# Patient Record
Sex: Female | Born: 1975 | Race: Black or African American | Hispanic: No | Marital: Married | Smoking: Never smoker
Health system: Southern US, Community
[De-identification: ages and names within clinical notes are randomized; demographics above are authoritative.]

## PROBLEM LIST (undated history)

## (undated) DIAGNOSIS — M329 Systemic lupus erythematosus, unspecified: Secondary | ICD-10-CM

## (undated) DIAGNOSIS — I10 Essential (primary) hypertension: Secondary | ICD-10-CM

## (undated) DIAGNOSIS — IMO0002 Reserved for concepts with insufficient information to code with codable children: Secondary | ICD-10-CM

---

## 2012-09-23 ENCOUNTER — Encounter (HOSPITAL_COMMUNITY): Payer: Self-pay

## 2012-09-23 ENCOUNTER — Emergency Department (HOSPITAL_COMMUNITY): Payer: Self-pay

## 2012-09-23 ENCOUNTER — Emergency Department (HOSPITAL_COMMUNITY)
Admission: EM | Admit: 2012-09-23 | Discharge: 2012-09-23 | Disposition: A | Discharge status: Home / Self Care | Payer: Self-pay | Attending: Emergency Medicine | Admitting: Emergency Medicine

## 2012-09-23 DIAGNOSIS — Y9389 Activity, other specified: Secondary | ICD-10-CM | POA: Insufficient documentation

## 2012-09-23 DIAGNOSIS — S6990XA Unspecified injury of unspecified wrist, hand and finger(s), initial encounter: Secondary | ICD-10-CM | POA: Insufficient documentation

## 2012-09-23 DIAGNOSIS — Z8739 Personal history of other diseases of the musculoskeletal system and connective tissue: Secondary | ICD-10-CM | POA: Insufficient documentation

## 2012-09-23 DIAGNOSIS — S59909A Unspecified injury of unspecified elbow, initial encounter: Secondary | ICD-10-CM | POA: Insufficient documentation

## 2012-09-23 DIAGNOSIS — M25519 Pain in unspecified shoulder: Secondary | ICD-10-CM

## 2012-09-23 DIAGNOSIS — S4980XA Other specified injuries of shoulder and upper arm, unspecified arm, initial encounter: Secondary | ICD-10-CM | POA: Insufficient documentation

## 2012-09-23 DIAGNOSIS — W010XXA Fall on same level from slipping, tripping and stumbling without subsequent striking against object, initial encounter: Secondary | ICD-10-CM | POA: Insufficient documentation

## 2012-09-23 DIAGNOSIS — S46909A Unspecified injury of unspecified muscle, fascia and tendon at shoulder and upper arm level, unspecified arm, initial encounter: Secondary | ICD-10-CM | POA: Insufficient documentation

## 2012-09-23 DIAGNOSIS — Y929 Unspecified place or not applicable: Secondary | ICD-10-CM | POA: Insufficient documentation

## 2012-09-23 HISTORY — DX: Reserved for concepts with insufficient information to code with codable children: IMO0002

## 2012-09-23 HISTORY — DX: Systemic lupus erythematosus, unspecified: M32.9

## 2012-09-23 MED ORDER — NAPROXEN 500 MG PO TABS: 500.0000 mg | ORAL_TABLET | Freq: Two times a day (BID) | ORAL | Status: DC | PRN

## 2012-09-23 MED ORDER — IBUPROFEN 400 MG PO TABS
600.0000 mg | ORAL_TABLET | Freq: Once | ORAL | Status: AC
  Administered 2012-09-23: 600 mg via ORAL
  Filled 2012-09-23: qty 1

## 2012-09-23 MED ORDER — OXYCODONE-ACETAMINOPHEN 5-325 MG PO TABS
2.0000 | ORAL_TABLET | Freq: Once | ORAL | Status: AC
  Administered 2012-09-23: 2 via ORAL
  Filled 2012-09-23: qty 2

## 2012-09-23 NOTE — ED Notes (Signed)
Patient able to ambulate around room independently

## 2012-09-23 NOTE — ED Notes (Signed)
Pt presents with L shoulder, arm and wrist pain after slipping on soap in tub and falling.  Pt reports hitting back of her head, landing on L shoulder.  Pt denies any LOC.

## 2012-09-23 NOTE — ED Provider Notes (Signed)
History    37 year old female with left shoulder pain. Onset shortly before arrival. Patient slipped earlier today in the shower fell in her left side. Persistent shoulder pain since then. Denies any significant pain anywhere else. No numbness, tingling or loss of strength. She did strike her head, but she denies headache, neck or back pain. No visual complaints. No use of blood thinning medication.  CSN: [id number]  Arrival date & time 09/23/12  1229   First MD Initiated Contact with Patient 09/23/12 1349      Chief Complaint  Patient presents with  . Fall    (Consider location/radiation/quality/duration/timing/severity/associated sxs/prior treatment) HPI  Past Medical History  Diagnosis Date  . Lupus     Past Surgical History  Procedure Date  . Cesarean section     History reviewed. No pertinent family history.  History  Substance Use Topics  . Smoking status: Never Smoker   . Smokeless tobacco: Not on file  . Alcohol Use: Yes    OB History    Grav Para Term Preterm Abortions TAB SAB Ect Mult Living                  Review of Systems  All systems reviewed and negative, other than as noted in HPI.   Allergies  Review of patient's allergies indicates no known allergies.  Home Medications   Current Outpatient Rx  Name  Route  Sig  Dispense  Refill  . IBUPROFEN 200 MG PO TABS   Oral   Take 200 mg by mouth every 6 (six) hours as needed. For pain         . NAPROXEN 500 MG PO TABS   Oral   Take 1 tablet (500 mg total) by mouth 2 (two) times daily as needed.   20 tablet   0     BP 164/97  Pulse 90  Temp 98.4 F (36.9 C) (Oral)  Resp 16  SpO2 100%  LMP 09/09/2012  Physical Exam  Nursing note and vitals reviewed. Constitutional: She appears well-developed and well-nourished. No distress.  HENT:  Head: Normocephalic and atraumatic.  Eyes: Conjunctivae normal are normal. Right eye exhibits no discharge. Left eye exhibits no discharge.  Neck:  Neck supple.  Cardiovascular: Normal rate, regular rhythm and normal heart sounds.  Exam reveals no gallop and no friction rub.   No murmur heard. Pulmonary/Chest: Effort normal and breath sounds normal. No respiratory distress.  Abdominal: Soft. She exhibits no distension. There is no tenderness.  Musculoskeletal: She exhibits no edema and no tenderness.       Left shoulder symmetric as compared to the right. No concerning skin lesions noted. Point tenderness over the left a.c. joint. Full range of motion of the left shoulder. Neurovascular intact distally. No midline spinal tenderness.  Neurological: She is alert.  Skin: Skin is warm and dry.  Psychiatric: She has a normal mood and affect. Her behavior is normal. Thought content normal.    ED Course  Procedures (including critical care time)  Labs Reviewed - No data to display Dg Shoulder Left  09/23/2012  *RADIOLOGY REPORT*  Clinical Data: Fall, left shoulder pain.  LEFT SHOULDER - 2+ VIEW  Comparison: None  Findings: No acute bony abnormality.  Specifically, no fracture, subluxation, or dislocation.  Soft tissues are intact.  IMPRESSION: No acute bony abnormality.   Original Report Authenticated By: Charlett Nose, M.D.      1. AC joint pain       MDM  36yl  female with left shoulder pain. Patient has point tenderness over her left a.c. joint. Her imaging is negative for any acute abnormalities. Suspect sprain, or possibly minimal dislocation No significant malalignment of a.c. joint was appreciated. Plan sling for comfort and as needed pain medication.        Raeford Razor, MD 09/23/12 [phone]

## 2014-04-06 ENCOUNTER — Encounter: Payer: Self-pay | Admitting: Internal Medicine

## 2014-04-06 ENCOUNTER — Ambulatory Visit: Payer: Self-pay | Attending: Internal Medicine | Admitting: Internal Medicine

## 2014-04-06 VITALS — BP 145/90 | HR 81 | Temp 98.2°F | Resp 16 | Wt 252.0 lb

## 2014-04-06 DIAGNOSIS — R635 Abnormal weight gain: Secondary | ICD-10-CM

## 2014-04-06 DIAGNOSIS — R7309 Other abnormal glucose: Secondary | ICD-10-CM

## 2014-04-06 DIAGNOSIS — Z8269 Family history of other diseases of the musculoskeletal system and connective tissue: Secondary | ICD-10-CM

## 2014-04-06 DIAGNOSIS — R768 Other specified abnormal immunological findings in serum: Secondary | ICD-10-CM

## 2014-04-06 DIAGNOSIS — R894 Abnormal immunological findings in specimens from other organs, systems and tissues: Secondary | ICD-10-CM

## 2014-04-06 DIAGNOSIS — M2550 Pain in unspecified joint: Secondary | ICD-10-CM

## 2014-04-06 DIAGNOSIS — R5381 Other malaise: Secondary | ICD-10-CM

## 2014-04-06 DIAGNOSIS — K5900 Constipation, unspecified: Secondary | ICD-10-CM

## 2014-04-06 DIAGNOSIS — Z840 Family history of diseases of the skin and subcutaneous tissue: Secondary | ICD-10-CM

## 2014-04-06 DIAGNOSIS — R7303 Prediabetes: Secondary | ICD-10-CM

## 2014-04-06 DIAGNOSIS — Z139 Encounter for screening, unspecified: Secondary | ICD-10-CM

## 2014-04-06 DIAGNOSIS — R5383 Other fatigue: Principal | ICD-10-CM

## 2014-04-06 DIAGNOSIS — I10 Essential (primary) hypertension: Secondary | ICD-10-CM

## 2014-04-06 LAB — CBC WITH DIFFERENTIAL/PLATELET
BASOS ABS: 0 10*3/uL (ref 0.0–0.1)
BASOS PCT: 0 % (ref 0–1)
EOS ABS: 0.1 10*3/uL (ref 0.0–0.7)
EOS PCT: 1 % (ref 0–5)
HEMATOCRIT: 29.6 % — AB (ref 36.0–46.0)
HEMOGLOBIN: 9.2 g/dL — AB (ref 12.0–15.0)
Lymphocytes Relative: 23 % (ref 12–46)
Lymphs Abs: 1.4 10*3/uL (ref 0.7–4.0)
MCH: 22.7 pg — AB (ref 26.0–34.0)
MCHC: 31.1 g/dL (ref 30.0–36.0)
MCV: 73.1 fL — AB (ref 78.0–100.0)
MONO ABS: 0.4 10*3/uL (ref 0.1–1.0)
MONOS PCT: 6 % (ref 3–12)
NEUTROS ABS: 4.1 10*3/uL (ref 1.7–7.7)
Neutrophils Relative %: 70 % (ref 43–77)
Platelets: 383 10*3/uL (ref 150–400)
RBC: 4.05 MIL/uL (ref 3.87–5.11)
RDW: 17 % — AB (ref 11.5–15.5)
WBC: 5.9 10*3/uL (ref 4.0–10.5)

## 2014-04-06 LAB — COMPLETE METABOLIC PANEL WITH GFR
ALBUMIN: 4 g/dL (ref 3.5–5.2)
ALK PHOS: 64 U/L (ref 39–117)
ALT: 11 U/L (ref 0–35)
AST: 16 U/L (ref 0–37)
BUN: 8 mg/dL (ref 6–23)
CO2: 27 mEq/L (ref 19–32)
CREATININE: 0.85 mg/dL (ref 0.50–1.10)
Calcium: 9.1 mg/dL (ref 8.4–10.5)
Chloride: 105 mEq/L (ref 96–112)
GFR, EST NON AFRICAN AMERICAN: 88 mL/min
GFR, Est African American: >89 mL/min
GLUCOSE: 74 mg/dL (ref 70–99)
POTASSIUM: 4.2 meq/L (ref 3.5–5.3)
Sodium: 139 mEq/L (ref 135–145)
Total Bilirubin: 0.5 mg/dL (ref 0.2–1.2)
Total Protein: 7.5 g/dL (ref 6.0–8.3)

## 2014-04-06 LAB — RHEUMATOID FACTOR: Rhuematoid fact SerPl-aCnc: <10 IU/mL (ref <=14)

## 2014-04-06 LAB — HEMOGLOBIN A1C
Hgb A1c MFr Bld: 6.1 % — ABNORMAL HIGH (ref <5.7)
MEAN PLASMA GLUCOSE: 128 mg/dL — AB (ref <117)

## 2014-04-06 MED ORDER — HYDROCHLOROTHIAZIDE 25 MG PO TABS: 25.0000 mg | ORAL_TABLET | Freq: Every day | ORAL | Status: DC

## 2014-04-06 NOTE — Patient Instructions (Signed)
Diabetes Mellitus and Food It is important for you to manage your blood sugar (glucose) level. Your blood glucose level can be greatly affected by what you eat. Eating healthier foods in the appropriate amounts throughout the day at about the same time each day will help you control your blood glucose level. It can also help slow or prevent worsening of your diabetes mellitus. Healthy eating may even help you improve the level of your blood pressure and reach or maintain a healthy weight.  HOW CAN FOOD AFFECT ME? Carbohydrates Carbohydrates affect your blood glucose level more than any other type of food. Your dietitian will help you determine how many carbohydrates to eat at each meal and teach you how to count carbohydrates. Counting carbohydrates is important to keep your blood glucose at a healthy level, especially if you are using insulin or taking certain medicines for diabetes mellitus. Alcohol Alcohol can cause sudden decreases in blood glucose (hypoglycemia), especially if you use insulin or take certain medicines for diabetes mellitus. Hypoglycemia can be a life-threatening condition. Symptoms of hypoglycemia (sleepiness, dizziness, and disorientation) are similar to symptoms of having too much alcohol.  If your health care provider has given you approval to drink alcohol, do so in moderation and use the following guidelines:  Women should not have more than one drink per day, and men should not have more than two drinks per day. One drink is equal to:  12 oz of beer.  5 oz of wine.  1 oz of hard liquor.  Do not drink on an empty stomach.  Keep yourself hydrated. Have water, diet soda, or unsweetened iced tea.  Regular soda, juice, and other mixers might contain a lot of carbohydrates and should be counted. WHAT FOODS ARE NOT RECOMMENDED? As you make food choices, it is important to remember that all foods are not the same. Some foods have fewer nutrients per serving than other  foods, even though they might have the same number of calories or carbohydrates. It is difficult to get your body what it needs when you eat foods with fewer nutrients. Examples of foods that you should avoid that are high in calories and carbohydrates but low in nutrients include:  Trans fats (most processed foods list trans fats on the Nutrition Facts label).  Regular soda.  Juice.  Candy.  Sweets, such as cake, pie, doughnuts, and cookies.  Fried foods. WHAT FOODS CAN I EAT? Have nutrient-rich foods, which will nourish your body and keep you healthy. The food you should eat also will depend on several factors, including:  The calories you need.  The medicines you take.  Your weight.  Your blood glucose level.  Your blood pressure level.  Your cholesterol level. You also should eat a variety of foods, including:  Protein, such as meat, poultry, fish, tofu, nuts, and seeds (lean animal proteins are best).  Fruits.  Vegetables.  Dairy products, such as milk, cheese, and yogurt (low fat is best).  Breads, grains, pasta, cereal, rice, and beans.  Fats such as olive oil, trans fat-free margarine, canola oil, avocado, and olives. DOES EVERYONE WITH DIABETES MELLITUS HAVE THE SAME MEAL PLAN? Because every person with diabetes mellitus is different, there is not one meal plan that works for everyone. It is very important that you meet with a dietitian who will help you create a meal plan that is just right for you. Document Released: 05/22/2005 Document Revised: 08/30/2013 Document Reviewed: 07/22/2013 ExitCare Patient Information 2015 [employer]. This   information is not intended to replace advice given to you by your health care provider. Make sure you discuss any questions you have with your health care provider. DASH Eating Plan DASH stands for "Dietary Approaches to Stop Hypertension." The DASH eating plan is a healthy eating plan that has been shown to reduce high  blood pressure (hypertension). Additional health benefits may include reducing the risk of type 2 diabetes mellitus, heart disease, and stroke. The DASH eating plan may also help with weight loss. WHAT DO I NEED TO KNOW ABOUT THE DASH EATING PLAN? For the DASH eating plan, you will follow these general guidelines:  Choose foods with a percent daily value for sodium of less than 5% (as listed on the food label).  Use salt-free seasonings or herbs instead of table salt or sea salt.  Check with your health care provider or pharmacist before using salt substitutes.  Eat lower-sodium products, often labeled as "lower sodium" or "no salt added."  Eat fresh foods.  Eat more vegetables, fruits, and low-fat dairy products.  Choose whole grains. Look for the word "whole" as the first word in the ingredient list.  Choose fish and skinless chicken or turkey more often than red meat. Limit fish, poultry, and meat to 6 oz (170 g) each day.  Limit sweets, desserts, sugars, and sugary drinks.  Choose heart-healthy fats.  Limit cheese to 1 oz (28 g) per day.  Eat more home-cooked food and less restaurant, buffet, and fast food.  Limit fried foods.  Cook foods using methods other than frying.  Limit canned vegetables. If you do use them, rinse them well to decrease the sodium.  When eating at a restaurant, ask that your food be prepared with less salt, or no salt if possible. WHAT FOODS CAN I EAT? Seek help from a dietitian for individual calorie needs. Grains Whole grain or whole wheat bread. Brown rice. Whole grain or whole wheat pasta. Quinoa, bulgur, and whole grain cereals. Low-sodium cereals. Corn or whole wheat flour tortillas. Whole grain cornbread. Whole grain crackers. Low-sodium crackers. Vegetables Fresh or frozen vegetables (raw, steamed, roasted, or grilled). Low-sodium or reduced-sodium tomato and vegetable juices. Low-sodium or reduced-sodium tomato sauce and paste. Low-sodium  or reduced-sodium canned vegetables.  Fruits All fresh, canned (in natural juice), or frozen fruits. Meat and Other Protein Products Ground beef (85% or leaner), grass-fed beef, or beef trimmed of fat. Skinless chicken or turkey. Ground chicken or turkey. Pork trimmed of fat. All fish and seafood. Eggs. Dried beans, peas, or lentils. Unsalted nuts and seeds. Unsalted canned beans. Dairy Low-fat dairy products, such as skim or 1% milk, 2% or reduced-fat cheeses, low-fat ricotta or cottage cheese, or plain low-fat yogurt. Low-sodium or reduced-sodium cheeses. Fats and Oils Tub margarines without trans fats. Light or reduced-fat mayonnaise and salad dressings (reduced sodium). Avocado. Safflower, olive, or canola oils. Natural peanut or almond butter. Other Unsalted popcorn and pretzels. The items listed above may not be a complete list of recommended foods or beverages. Contact your dietitian for more options. WHAT FOODS ARE NOT RECOMMENDED? Grains White bread. White pasta. White rice. Refined cornbread. Bagels and croissants. Crackers that contain trans fat. Vegetables Creamed or fried vegetables. Vegetables in a cheese sauce. Regular canned vegetables. Regular canned tomato sauce and paste. Regular tomato and vegetable juices. Fruits Dried fruits. Canned fruit in light or heavy syrup. Fruit juice. Meat and Other Protein Products Fatty cuts of meat. Ribs, chicken wings, bacon, sausage, bologna, salami, chitterlings, fatback, hot   dogs, bratwurst, and packaged luncheon meats. Salted nuts and seeds. Canned beans with salt. Dairy Whole or 2% milk, cream, half-and-half, and cream cheese. Whole-fat or sweetened yogurt. Full-fat cheeses or blue cheese. Nondairy creamers and whipped toppings. Processed cheese, cheese spreads, or cheese curds. Condiments Onion and garlic salt, seasoned salt, table salt, and sea salt. Canned and packaged gravies. Worcestershire sauce. Tartar sauce. Barbecue sauce.  Teriyaki sauce. Soy sauce, including reduced sodium. Steak sauce. Fish sauce. Oyster sauce. Cocktail sauce. Horseradish. Ketchup and mustard. Meat flavorings and tenderizers. Bouillon cubes. Hot sauce. Tabasco sauce. Marinades. Taco seasonings. Relishes. Fats and Oils Butter, stick margarine, lard, shortening, ghee, and bacon fat. Coconut, palm kernel, or palm oils. Regular salad dressings. Other Pickles and olives. Salted popcorn and pretzels. The items listed above may not be a complete list of foods and beverages to avoid. Contact your dietitian for more information. WHERE CAN I FIND MORE INFORMATION? National Heart, Lung, and [employer] Institute: www.nhlbi.nih.gov/health/health-topics/topics/dash/ Document Released: 08/14/2011 Document Revised: 01/09/2014 Document Reviewed: 06/29/2013 ExitCare Patient Information 2015 [employer]. This information is not intended to replace advice given to you by your health care provider. Make sure you discuss any questions you have with your health care provider.  

## 2014-04-06 NOTE — Progress Notes (Signed)
Patient here to establish care Has history of lupus Complains of feeling fatigued States she is loosing the ability to use her hands Has been eating healthy but feels she is gaining weight, not loosing

## 2014-04-06 NOTE — Progress Notes (Signed)
Patient Demographics  Diane Ramsey, is a 38 y.o. female  RWE:[id number]  YPP:[id number]  DOB - 16-Oct-1975  CC:  Chief Complaint  Patient presents with  . Establish Care  . Lupus       HPI: Diane Ramsey is a 38 y.o. female here today to establish medical care. Patient reported to have history of chronic joint pain, she had a blood work done in 2011 her ANA was positive as per patient she saw a rheumatologist but was not sure if she was diagnosed with lupus, she reports family history of SLE her son is also diagnosed with lupus as well, as per patient she also had a rash on her face but which is now resolved, she also reported to have gained weight denies any family history of thyroid problems, reports constipation and she has been modifying her diet and occasionally uses laxative. Patient also history of hypertension and used to be on hydrochlorothiazide her blood pressure is borderline elevated, patient also has history of prediabetes her last hemoglobin A1c was 6.0%. Patient complaining of gaining weight and feeling tired and fatigued. She also reported to have hand joint pain and stiffness. Patient has No headache, No chest pain, No abdominal pain - No Nausea, No new weakness tingling or numbness, No Cough - SOB.  No Known Allergies Past Medical History  Diagnosis Date  . Lupus    Current Outpatient Prescriptions on File Prior to Visit  Medication Sig Dispense Refill  . ibuprofen (ADVIL,MOTRIN) 200 MG tablet Take 200 mg by mouth every 6 (six) hours as needed. For pain      . naproxen (NAPROSYN) 500 MG tablet Take 1 tablet (500 mg total) by mouth 2 (two) times daily as needed.  20 tablet  0   No current facility-administered medications on file prior to visit.   Family History  Problem Relation Age of Onset  . Lupus Son   . Diabetes Father   . Stroke Sister    History   Social History  . Marital Status: Married    Spouse Name: N/A    Number of Children: N/A  . Years  of Education: N/A   Occupational History  . Not on file.   Social History Main Topics  . Smoking status: Never Smoker   . Smokeless tobacco: Not on file  . Alcohol Use: No  . Drug Use: No  . Sexual Activity: Not on file   Other Topics Concern  . Not on file   Social History Narrative  . No narrative on file    Review of Systems: Constitutional: Negative for fever, chills, diaphoresis, activity change, appetite change and fatigue. HENT: Negative for ear pain, nosebleeds, congestion, facial swelling, rhinorrhea, neck pain, neck stiffness and ear discharge.  Eyes: Negative for pain, discharge, redness, itching and visual disturbance. Respiratory: Negative for cough, choking, chest tightness, shortness of breath, wheezing and stridor.  Cardiovascular: Negative for chest pain, palpitations and leg swelling. Gastrointestinal: Negative for abdominal distention. Genitourinary: Negative for dysuria, urgency, frequency, hematuria, flank pain, decreased urine volume, difficulty urinating and dyspareunia.  Musculoskeletal: Negative for back pain, joint swelling, arthralgia and gait problem. Neurological: Negative for dizziness, tremors, seizures, syncope, facial asymmetry, speech difficulty, weakness, light-headedness, numbness and headaches.  Hematological: Negative for adenopathy. Does not bruise/bleed easily. Psychiatric/Behavioral: Negative for hallucinations, behavioral problems, confusion, dysphoric mood, decreased concentration and agitation.    Objective:   Filed Vitals:   04/06/14 1103  BP: 145/90  Pulse: 81  Temp: 98.2  F (36.8 C)  Resp: 16    Physical Exam: Constitutional: Patient appears well-developed and well-nourished. No distress. HENT: Normocephalic, atraumatic, External right and left ear normal. Oropharynx is clear and moist.  Eyes: Conjunctivae and EOM are normal. PERRLA, no scleral icterus. Neck: Normal ROM. Neck supple. No JVD. No tracheal deviation. No  thyromegaly. CVS: RRR, S1/S2 +, no murmurs, no gallops, no carotid bruit.  Pulmonary: Effort and breath sounds normal, no stridor, rhonchi, wheezes, rales.  Abdominal: Soft. BS +, no distension, tenderness, rebound or guarding.  Musculoskeletal: Normal range of motion. No edema and no tenderness. Some tenderness on the MCP joint. Neuro: Alert. Normal reflexes, muscle tone coordination. No cranial nerve deficit. Skin: Skin is warm and dry. No rash noted. Not diaphoretic. No erythema. No pallor. Psychiatric: Normal mood and affect. Behavior, judgment, thought content normal.  No results found for this basename: WBC, HGB, HCT, MCV, PLT   No results found for this basename: CREATININE, BUN, NA, K, CL, CO2    No results found for this basename: HGBA1C   Lipid Panel  No results found for this basename: chol, trig, hdl, cholhdl, vldl, ldlcalc       Assessment and plan:   1. Other malaise and fatigue  - CBC with Differential - TSH  2. Weight gain Will check TSH level.  3. Prediabetes Advise patient for low carbohydrate will check - Hemoglobin A1c  4. Essential hypertension, benign Advised patient for DASH diet resume back on hydrochlorothiazide - hydrochlorothiazide (HYDRODIURIL) 25 MG tablet; Take 1 tablet (25 mg total) by mouth daily.  Dispense: 90 tablet; Refill: 3  5. Joint pain/6. Family history of systemic lupus erythematosus/8. ANA positive  - ANA - Anti-Smith antibody - Anti-DNA antibody, double-stranded - Rheumatoid factor - Ambulatory referral to Rheumatology  7. Unspecified constipation Advise patient for increase fiber diet   9. Screening  - COMPLETE METABOLIC PANEL WITH GFR - Vit D  25 hydroxy (rtn osteoporosis monitoring)   Return in about 3 months (around 07/07/2014) for hypertension.   Lorayne Marek, MD

## 2014-04-07 DIAGNOSIS — K5900 Constipation, unspecified: Secondary | ICD-10-CM | POA: Insufficient documentation

## 2014-04-07 DIAGNOSIS — M2550 Pain in unspecified joint: Secondary | ICD-10-CM | POA: Insufficient documentation

## 2014-04-07 DIAGNOSIS — Z8269 Family history of other diseases of the musculoskeletal system and connective tissue: Secondary | ICD-10-CM | POA: Insufficient documentation

## 2014-04-07 DIAGNOSIS — R5383 Other fatigue: Principal | ICD-10-CM

## 2014-04-07 DIAGNOSIS — R768 Other specified abnormal immunological findings in serum: Secondary | ICD-10-CM | POA: Insufficient documentation

## 2014-04-07 DIAGNOSIS — I10 Essential (primary) hypertension: Secondary | ICD-10-CM | POA: Insufficient documentation

## 2014-04-07 DIAGNOSIS — R7303 Prediabetes: Secondary | ICD-10-CM | POA: Insufficient documentation

## 2014-04-07 DIAGNOSIS — R635 Abnormal weight gain: Secondary | ICD-10-CM | POA: Insufficient documentation

## 2014-04-07 DIAGNOSIS — R5381 Other malaise: Secondary | ICD-10-CM | POA: Insufficient documentation

## 2014-04-07 LAB — TSH: TSH: 1.249 u[IU]/mL (ref 0.350–4.500)

## 2014-04-07 LAB — ANA: Anti Nuclear Antibody(ANA): NEGATIVE

## 2014-04-07 LAB — VITAMIN D 25 HYDROXY (VIT D DEFICIENCY, FRACTURES): VIT D 25 HYDROXY: 34 ng/mL (ref 30–89)

## 2014-04-07 LAB — ANTI-SMITH ANTIBODY: ENA SM AB SER-ACNC: NEGATIVE

## 2014-04-07 LAB — ANTI-DNA ANTIBODY, DOUBLE-STRANDED

## 2014-04-14 ENCOUNTER — Telehealth: Payer: Self-pay

## 2014-04-14 NOTE — Telephone Encounter (Signed)
Patient unavailable Unable to leave message No voice mail

## 2014-04-14 NOTE — Telephone Encounter (Signed)
Message copied by Dorothe Pea on Fri Apr 14, 2014  3:43 PM ------      Message from: Lorayne Marek      Created: Fri Apr 07, 2014 12:03 PM       Blood work reviewed noticed anemia, advise patient to take over-the-counter iron supplement 3 times a day.      Also her hemoglobin A1c is 6.1% she has prediabetes, advise patient for low carbohydrate diet. ------

## 2014-04-20 ENCOUNTER — Ambulatory Visit: Payer: Self-pay

## 2014-09-29 ENCOUNTER — Encounter: Payer: Self-pay | Admitting: Internal Medicine

## 2014-10-09 ENCOUNTER — Encounter: Payer: Self-pay | Admitting: Internal Medicine

## 2014-10-18 ENCOUNTER — Ambulatory Visit: Payer: Self-pay

## 2014-10-30 ENCOUNTER — Emergency Department (HOSPITAL_COMMUNITY)
Admission: EM | Admit: 2014-10-30 | Discharge: 2014-10-30 | Disposition: A | Discharge status: Home / Self Care | Payer: Self-pay | Attending: Emergency Medicine | Admitting: Emergency Medicine

## 2014-10-30 ENCOUNTER — Encounter (HOSPITAL_COMMUNITY): Payer: Self-pay | Admitting: Emergency Medicine

## 2014-10-30 ENCOUNTER — Emergency Department (HOSPITAL_COMMUNITY): Payer: Self-pay

## 2014-10-30 ENCOUNTER — Other Ambulatory Visit: Payer: Self-pay

## 2014-10-30 DIAGNOSIS — R42 Dizziness and giddiness: Secondary | ICD-10-CM | POA: Insufficient documentation

## 2014-10-30 DIAGNOSIS — Z8739 Personal history of other diseases of the musculoskeletal system and connective tissue: Secondary | ICD-10-CM | POA: Insufficient documentation

## 2014-10-30 DIAGNOSIS — D649 Anemia, unspecified: Secondary | ICD-10-CM | POA: Insufficient documentation

## 2014-10-30 DIAGNOSIS — I951 Orthostatic hypotension: Secondary | ICD-10-CM | POA: Insufficient documentation

## 2014-10-30 DIAGNOSIS — Z3202 Encounter for pregnancy test, result negative: Secondary | ICD-10-CM | POA: Insufficient documentation

## 2014-10-30 LAB — BASIC METABOLIC PANEL
ANION GAP: 7 (ref 5–15)
BUN: 8 mg/dL (ref 6–23)
CALCIUM: 8.9 mg/dL (ref 8.4–10.5)
CO2: 25 mmol/L (ref 19–32)
CREATININE: 0.74 mg/dL (ref 0.50–1.10)
Chloride: 109 mmol/L (ref 96–112)
GFR calc Af Amer: >90 mL/min (ref >90)
Glucose, Bld: 108 mg/dL — ABNORMAL HIGH (ref 70–99)
POTASSIUM: 3.8 mmol/L (ref 3.5–5.1)
Sodium: 141 mmol/L (ref 135–145)

## 2014-10-30 LAB — CBC WITH DIFFERENTIAL/PLATELET
BASOS ABS: 0 10*3/uL (ref 0.0–0.1)
Basophils Relative: 0 % (ref 0–1)
EOS PCT: 1 % (ref 0–5)
Eosinophils Absolute: 0.1 10*3/uL (ref 0.0–0.7)
HCT: 31.8 % — ABNORMAL LOW (ref 36.0–46.0)
HEMOGLOBIN: 9.1 g/dL — AB (ref 12.0–15.0)
LYMPHS ABS: 1.4 10*3/uL (ref 0.7–4.0)
LYMPHS PCT: 23 % (ref 12–46)
MCH: 20.5 pg — AB (ref 26.0–34.0)
MCHC: 28.6 g/dL — AB (ref 30.0–36.0)
MCV: 71.6 fL — AB (ref 78.0–100.0)
Monocytes Absolute: 0.2 10*3/uL (ref 0.1–1.0)
Monocytes Relative: 4 % (ref 3–12)
NEUTROS ABS: 4.5 10*3/uL (ref 1.7–7.7)
NEUTROS PCT: 72 % (ref 43–77)
Platelets: 385 10*3/uL (ref 150–400)
RBC: 4.44 MIL/uL (ref 3.87–5.11)
RDW: 18.4 % — ABNORMAL HIGH (ref 11.5–15.5)
WBC: 6.2 10*3/uL (ref 4.0–10.5)

## 2014-10-30 LAB — I-STAT TROPONIN, ED: Troponin i, poc: 0 ng/mL (ref 0.00–0.08)

## 2014-10-30 LAB — POC URINE PREG, ED: PREG TEST UR: NEGATIVE

## 2014-10-30 MED ORDER — SODIUM CHLORIDE 0.9 % IV BOLUS (SEPSIS)
1000.0000 mL | Freq: Once | INTRAVENOUS | Status: AC
  Administered 2014-10-30: 1000 mL via INTRAVENOUS

## 2014-10-30 MED ORDER — FERROUS SULFATE 325 (65 FE) MG PO TABS: 325.0000 mg | ORAL_TABLET | Freq: Two times a day (BID) | ORAL | Status: DC

## 2014-10-30 NOTE — ED Notes (Signed)
Ambulated pt to restroom  

## 2014-10-30 NOTE — ED Notes (Signed)
Pt c/o near syncope upon standing x several days with some left arm stiffness last night; pt sts some memory issues x weeks

## 2014-10-30 NOTE — Discharge Instructions (Signed)
Anemia, Nonspecific Anemia is a condition in which the concentration of red blood cells or hemoglobin in the blood is below normal. Hemoglobin is a substance in red blood cells that carries oxygen to the tissues of the body. Anemia results in not enough oxygen reaching these tissues.  CAUSES  Common causes of anemia include:   Excessive bleeding. Bleeding may be internal or external. This includes excessive bleeding from periods (in women) or from the intestine.   Poor nutrition.   Chronic kidney, thyroid, and liver disease.  Bone marrow disorders that decrease red blood cell production.  Cancer and treatments for cancer.  HIV, AIDS, and their treatments.  Spleen problems that increase red blood cell destruction.  Blood disorders.  Excess destruction of red blood cells due to infection, medicines, and autoimmune disorders. SIGNS AND SYMPTOMS   Minor weakness.   Dizziness.   Headache.  Palpitations.   Shortness of breath, especially with exercise.   Paleness.  Cold sensitivity.  Indigestion.  Nausea.  Difficulty sleeping.  Difficulty concentrating. Symptoms may occur suddenly or they may develop slowly.  DIAGNOSIS  Additional blood tests are often needed. These help your health care provider determine the best treatment. Your health care provider will check your stool for blood and look for other causes of blood loss.  TREATMENT  Treatment varies depending on the cause of the anemia. Treatment can include:   Supplements of iron, vitamin B12, or folic acid.   Hormone medicines.   A blood transfusion. This may be needed if blood loss is severe.   Hospitalization. This may be needed if there is significant continual blood loss.   Dietary changes.  Spleen removal. HOME CARE INSTRUCTIONS Keep all follow-up appointments. It often takes many weeks to correct anemia, and having your health care provider check on your condition and your response to  treatment is very important. SEEK IMMEDIATE MEDICAL CARE IF:   You develop extreme weakness, shortness of breath, or chest pain.   You become dizzy or have trouble concentrating.  You develop heavy vaginal bleeding.   You develop a rash.   You have bloody or black, tarry stools.   You faint.   You vomit up blood.   You vomit repeatedly.   You have abdominal pain.  You have a fever or persistent symptoms for more than 2-3 days.   You have a fever and your symptoms suddenly get worse.   You are dehydrated.  MAKE SURE YOU:  Understand these instructions.  Will watch your condition.  Will get help right away if you are not doing well or get worse. Document Released: 10/02/2004 Document Revised: 04/27/2013 Document Reviewed: 02/18/2013 Exit[employer] Patient Information 2015 [employer]. This information is not intended to replace advice given to you by your health care provider. Make sure you discuss any questions you have with your health care provider.  

## 2014-10-30 NOTE — ED Provider Notes (Signed)
CSN: [id number]     Arrival date & time 10/30/14  1136 History   First MD Initiated Contact with Patient 10/30/14 1610     Chief Complaint  Patient presents with  . Near Syncope     (Consider location/radiation/quality/duration/timing/severity/associated sxs/prior Treatment) HPI  39 yo F with PMHx of SLE, not currently on any medications, who presents with a 2-3 day history of general fatigue and lightheadedness upon standing, that resolves with sitting or resting. Patient states that she has been "very tired" and hasn't had much to drink over the past 2 days, and is subsequently experiencing lightheadedness and dizziness, causing her to "almost black out" when she goes from sitting to standing/other position changes. Her symptoms resolve with rest and she denies any associated numbness, weakness, or other neurological symptoms. No recent head trauma. No headaches. She did have cramps in her left arm but this is not new. She also reports she has been sleeping poorly, causing her to have difficulty focusing/remembering but this is also not new. No fever or chills. No neck pain or stiffness. No CP, SOB, palpitations. No recent sick contacts. No vaginal bleeding or discharge.  Past Medical History  Diagnosis Date  . Lupus    Past Surgical History  Procedure Laterality Date  . Cesarean section     Family History  Problem Relation Age of Onset  . Lupus Son   . Diabetes Father   . Stroke Sister    History  Substance Use Topics  . Smoking status: Never Smoker   . Smokeless tobacco: Not on file  . Alcohol Use: No   OB History    No data available     Review of Systems  Constitutional: Positive for fatigue. Negative for fever and chills.  HENT: Negative for congestion, rhinorrhea and sore throat.   Eyes: Negative for visual disturbance.  Respiratory: Negative for cough, shortness of breath and wheezing.   Cardiovascular: Negative for chest pain and leg swelling.  Gastrointestinal:  Negative for nausea, abdominal pain and diarrhea.  Genitourinary: Negative for dysuria.  Musculoskeletal: Negative for neck pain and neck stiffness.  Skin: Negative for rash.  Allergic/Immunologic: Negative for immunocompromised state.  Neurological: Positive for dizziness (upon standing) and light-headedness (upon standnig). Negative for syncope and weakness.      Allergies  Review of patient's allergies indicates no known allergies.  Home Medications   Prior to Admission medications   Medication Sig Start Date End Date Taking? Authorizing Provider  ibuprofen (ADVIL,MOTRIN) 200 MG tablet Take 200 mg by mouth every 6 (six) hours as needed. For pain   Yes Historical Provider, MD   BP 118/71 mmHg  Pulse 79  Temp(Src) 98.7 F (37.1 C) (Oral)  Resp 16  SpO2 99%  LMP 10/25/2014 Physical Exam  Constitutional: She is oriented to person, place, and time. She appears well-developed and well-nourished. No distress.  HENT:  Head: Normocephalic and atraumatic.  Mouth/Throat: No oropharyngeal exudate.  Eyes: Conjunctivae are normal. Pupils are equal, round, and reactive to light.  Neck: Neck supple.  Cardiovascular: Normal rate, normal heart sounds and intact distal pulses.  Exam reveals no friction rub.   No murmur heard. Pulmonary/Chest: Effort normal and breath sounds normal. No respiratory distress. She has no wheezes. She has no rales.  Abdominal: Soft. She exhibits no distension. There is no tenderness.  Musculoskeletal: She exhibits no edema.  Neurological: She is alert and oriented to person, place, and time. She has normal strength. She displays normal reflexes. No cranial  nerve deficit or sensory deficit. She exhibits normal muscle tone. Gait normal. GCS eye subscore is 4. GCS verbal subscore is 5. GCS motor subscore is 6.  Skin: Skin is warm. No rash noted.  Nursing note and vitals reviewed.   ED Course  Procedures (including critical care time) Labs Review Labs Reviewed   BASIC METABOLIC PANEL - Abnormal; Notable for the following:    Glucose, Bld 108 (*)    All other components within normal limits  CBC WITH DIFFERENTIAL/PLATELET - Abnormal; Notable for the following:    Hemoglobin 9.1 (*)    HCT 31.8 (*)    MCV 71.6 (*)    MCH 20.5 (*)    MCHC 28.6 (*)    RDW 18.4 (*)    All other components within normal limits  I-STAT TROPOININ, ED  POC URINE PREG, ED    Imaging Review Dg Chest 2 View  10/30/2014   CLINICAL DATA:  39 year old female with near syncope.  Lupus.  EXAM: CHEST  2 VIEW  COMPARISON:  No priors.  FINDINGS: Lung volumes are normal. No consolidative airspace disease. No pleural effusions. No pneumothorax. No pulmonary nodule or mass noted. Pulmonary vasculature and the cardiomediastinal silhouette are within normal limits.  IMPRESSION: No radiographic evidence of acute cardiopulmonary disease.   Electronically Signed   By: Vinnie Langton M.D.   On: 10/30/2014 13:13     EKG Interpretation   Date/Time:  Monday October 30 2014 11:45:23 EST Ventricular Rate:  79 PR Interval:  152 QRS Duration: 94 QT Interval:  388 QTC Calculation: 444 R Axis:   12 Text Interpretation:  Normal sinus rhythm Normal ECG Confirmed by BEATON   MD, ROBERT ([id number]) on 10/30/2014 4:58:53 PM      MDM   Final diagnoses:  None    39 yo AAF with PMHx of SLE, mild and not on anticoagulants, who p/w a several day h/o dizziness and lightheadedness upon standing, that resolves with rest/position stability See HPI above. On arrival, T 98.14F, HR 83, RR 18, BP 142/83, satting 100% on RA. Exam as above, pt overall very well-appearing and in NAD. Neuro exam non-focal. Pt non-toxic.  Pt's presentation is most c/w orthostatic dizziness. Orthostatic VS stable in ED but pt did endorse dizziness, but not vertigo, upon sitting upright. No signs of peripheral or central vertigo. Sx resolve with maintaining position, c/w orthostasis. EKG shows NSR, no acute ST-T segment  changes, no WPW, long QT or evidence of arrhythmia or ACS and pt denies any CP, SOB, or palpitations during the episodes. No focal neuro deficits, and pt never actually lost consciousness - do not suspect CVA or seizure. No h/o early heart disease or arrhythmia in family. Will send screening labs, ambulate, and give 1L NS.  Sx resolved after 1L NS. Labs as above. CBC with microcytic anemia with Hgb 9.1, similar to prior. Pt has been off her iron supplements. This is likely contributing to her orthostasis but is not acute, and pt o/w asymptomatic. Troponin negative. BMP unremarkable and UPT negative. CXR clear. Will treat with iron supplementation, encouraged fluids, and PCP f/u in 1 week. Pt in agreement. Pt ambulatory without difficulty and asymptomatic at this time.  Clinical Impression: 1. Anemia, unspecified anemia type   2. Orthostatic dizziness     Disposition: Discharge  Condition: Good  I have discussed the results, Dx and Tx plan with the pt(& family if present). He/she/they expressed understanding and agree(s) with the plan. Discharge instructions discussed at great length.  Strict return precautions discussed and pt &/or family have verbalized understanding of the instructions. No further questions at time of discharge.    Discharge Medication List as of 10/30/2014  5:42 PM    START taking these medications   Details  ferrous sulfate 325 (65 FE) MG tablet Take 1 tablet (325 mg total) by mouth 2 (two) times daily with a meal., Starting 10/30/2014, Until Discontinued, Print        Follow Up: Lorayne Marek, MD [place] [id number] [phone]   Follow-up with your PCP in 1 week for repeat Hemoglobin/blood check  [facility] [address] [place] [address] [phone]  Call to set up an outpatient appointment for possible cardiac/Holter monitor   Pt seen in conjunction with Dr.  Marlynn Perking, MD 10/31/14 [place], MD 11/01/14 1945

## 2014-11-17 ENCOUNTER — Emergency Department (HOSPITAL_COMMUNITY)
Admission: EM | Admit: 2014-11-17 | Discharge: 2014-11-17 | Disposition: A | Discharge status: Home / Self Care | Payer: Self-pay | Attending: Emergency Medicine | Admitting: Emergency Medicine

## 2014-11-17 ENCOUNTER — Emergency Department (HOSPITAL_COMMUNITY): Payer: Self-pay

## 2014-11-17 ENCOUNTER — Encounter (HOSPITAL_COMMUNITY): Payer: Self-pay

## 2014-11-17 DIAGNOSIS — D649 Anemia, unspecified: Secondary | ICD-10-CM | POA: Insufficient documentation

## 2014-11-17 DIAGNOSIS — Z79899 Other long term (current) drug therapy: Secondary | ICD-10-CM | POA: Insufficient documentation

## 2014-11-17 DIAGNOSIS — Z8739 Personal history of other diseases of the musculoskeletal system and connective tissue: Secondary | ICD-10-CM | POA: Insufficient documentation

## 2014-11-17 DIAGNOSIS — R079 Chest pain, unspecified: Secondary | ICD-10-CM | POA: Insufficient documentation

## 2014-11-17 DIAGNOSIS — R55 Syncope and collapse: Secondary | ICD-10-CM | POA: Insufficient documentation

## 2014-11-17 LAB — CBC
HEMATOCRIT: 33.8 % — AB (ref 36.0–46.0)
Hemoglobin: 10.1 g/dL — ABNORMAL LOW (ref 12.0–15.0)
MCH: 22.2 pg — ABNORMAL LOW (ref 26.0–34.0)
MCHC: 29.9 g/dL — AB (ref 30.0–36.0)
MCV: 74.4 fL — ABNORMAL LOW (ref 78.0–100.0)
Platelets: 287 10*3/uL (ref 150–400)
RBC: 4.54 MIL/uL (ref 3.87–5.11)
RDW: 22.3 % — AB (ref 11.5–15.5)
WBC: 7.4 10*3/uL (ref 4.0–10.5)

## 2014-11-17 LAB — BASIC METABOLIC PANEL
Anion gap: 7 (ref 5–15)
BUN: 11 mg/dL (ref 6–23)
CHLORIDE: 108 mmol/L (ref 96–112)
CO2: 23 mmol/L (ref 19–32)
CREATININE: 0.88 mg/dL (ref 0.50–1.10)
Calcium: 9 mg/dL (ref 8.4–10.5)
GFR calc non Af Amer: 82 mL/min — ABNORMAL LOW (ref >90)
Glucose, Bld: 89 mg/dL (ref 70–99)
POTASSIUM: 4 mmol/L (ref 3.5–5.1)
Sodium: 138 mmol/L (ref 135–145)

## 2014-11-17 LAB — D-DIMER, QUANTITATIVE: D-Dimer, Quant: 0.76 ug/mL-FEU — ABNORMAL HIGH (ref 0.00–0.48)

## 2014-11-17 LAB — I-STAT TROPONIN, ED: TROPONIN I, POC: 0.08 ng/mL (ref 0.00–0.08)

## 2014-11-17 LAB — BRAIN NATRIURETIC PEPTIDE: B Natriuretic Peptide: 25.4 pg/mL (ref 0.0–100.0)

## 2014-11-17 LAB — HCG, QUANTITATIVE, PREGNANCY

## 2014-11-17 MED ORDER — IOHEXOL 350 MG/ML SOLN
100.0000 mL | Freq: Once | INTRAVENOUS | Status: AC | PRN
  Administered 2014-11-17: 85 mL via INTRAVENOUS

## 2014-11-17 NOTE — ED Notes (Signed)
Per EMS, Patient had sudden onset of central chest pain radiating down the left arm. Patient reports Shortness of Breath and Dizziness upon pains arrival. 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 Patient denies LOC and was alert and oriented upon arrival. Vitals per EMS: 134/76, 87 HR, 18 RR, 96% on RA, 0/10 with 324 mg of ASA and 1 Nitro.

## 2014-11-17 NOTE — ED Provider Notes (Signed)
CSN: [id number]     Arrival date & time 11/17/14  1350 History   None    Chief Complaint  Patient presents with  . Chest Pain     (Consider location/radiation/quality/duration/timing/severity/associated sxs/prior Treatment) HPI Comments: Pt comes in with c/o acute on set of cp with radiation down the arm. Pt states that she was diaphoretic and sob and felt near syncopal. Pt states that she had similar episode of feeling near syncopal last week. Pt states that she also had nausea. She states that after aspirin and nitro her symptoms have completely resolved. Pt has lupus but had not had an complications.  The history is provided by the patient. No language interpreter was used.    Past Medical History  Diagnosis Date  . Lupus    Past Surgical History  Procedure Laterality Date  . Cesarean section     Family History  Problem Relation Age of Onset  . Lupus Son   . Diabetes Father   . Stroke Sister    History  Substance Use Topics  . Smoking status: Never Smoker   . Smokeless tobacco: Not on file  . Alcohol Use: No   OB History    No data available     Review of Systems  All other systems reviewed and are negative.     Allergies  Review of patient's allergies indicates no known allergies.  Home Medications   Prior to Admission medications   Medication Sig Start Date End Date Taking? Authorizing Provider  ferrous sulfate 325 (65 FE) MG tablet Take 1 tablet (325 mg total) by mouth 2 (two) times daily with a meal. 10/30/14   Duffy Bruce, MD  ibuprofen (ADVIL,MOTRIN) 200 MG tablet Take 200 mg by mouth every 6 (six) hours as needed. For pain    Historical Provider, MD   BP 116/66 mmHg  Pulse 78  Temp(Src) 98.5 F (36.9 C) (Oral)  Resp 22  SpO2 100%  LMP 10/25/2014 Physical Exam  Constitutional: She is oriented to person, place, and time. She appears well-developed and well-nourished.  HENT:  Head: Normocephalic and atraumatic.  Cardiovascular: Normal rate  and regular rhythm.   Pulmonary/Chest: Effort normal and breath sounds normal.  Abdominal: Soft. Bowel sounds are normal.  Musculoskeletal: Normal range of motion.  Neurological: She is alert and oriented to person, place, and time.  Skin: Skin is warm and dry.  Psychiatric: She has a normal mood and affect.  Nursing note and vitals reviewed.   ED Course  Procedures (including critical care time) Labs Review Labs Reviewed  CBC - Abnormal; Notable for the following:    Hemoglobin 10.1 (*)    HCT 33.8 (*)    MCV 74.4 (*)    MCH 22.2 (*)    MCHC 29.9 (*)    RDW 22.3 (*)    All other components within normal limits  BASIC METABOLIC PANEL - Abnormal; Notable for the following:    GFR calc non Af Amer 82 (*)    All other components within normal limits  D-DIMER, QUANTITATIVE - Abnormal; Notable for the following:    D-Dimer, Quant 0.76 (*)    All other components within normal limits  BRAIN NATRIURETIC PEPTIDE  HCG, QUANTITATIVE, PREGNANCY  I-STAT TROPOININ, ED    Imaging Review Ct Angio Chest Pe W/cm &/or Wo Cm  11/17/2014   CLINICAL DATA:  Sudden onset central chest pain radiating down LEFT arm with associated shortness of breath and dizziness, history lupus  EXAM: CT ANGIOGRAPHY  CHEST WITH CONTRAST  TECHNIQUE: Multidetector CT imaging of the chest was performed using the standard protocol during bolus administration of intravenous contrast. Multiplanar CT image reconstructions and MIPs were obtained to evaluate the vascular anatomy.  CONTRAST:  61mL OMNIPAQUE IOHEXOL 350 MG/ML SOLN IV  COMPARISON:  None  FINDINGS: Visualized upper abdomen unremarkable.  Aorta normal caliber without aneurysm or dissection.  No thoracic adenopathy.  Scattered respiratory motion artifacts at the lower lobes.  Pulmonary arteries patent.  No evidence of pulmonary embolism.  Lungs clear.  No infiltrate, pleural effusion or pneumothorax.  Generally low lung volumes.  No acute osseous findings.  Review of the  MIP images confirms the above findings.  IMPRESSION: No evidence of pulmonary embolism.  Low lung volumes without definite acute intrathoracic abnormality.   Electronically Signed   By: Lavonia Dana M.D.   On: 11/17/2014 17:04   Dg Chest Port 1 View  11/17/2014   CLINICAL DATA:  Tightness and chest pain.  EXAM: PORTABLE CHEST - 1 VIEW  COMPARISON:  Since 10/30/2014  FINDINGS: Numerous leads and wires project over the chest. Midline trachea. Cardiomegaly accentuated by AP portable technique. No pleural effusion or pneumothorax. Low lung volumes with resultant pulmonary interstitial prominence. Clear lungs.  IMPRESSION: Mild cardiomegaly and low lung volumes. No acute cardiopulmonary disease.   Electronically Signed   By: Abigail Miyamoto M.D.   On: 11/17/2014 14:36     EKG Interpretation   Date/Time:  Friday November 17 2014 13:58:50 EST Ventricular Rate:  76 PR Interval:  165 QRS Duration: 106 QT Interval:  406 QTC Calculation: 456 R Axis:   61 Text Interpretation:  Sinus rhythm RSR' in V1 or V2, probably normal  variant Borderline T wave abnormalities Confirmed by DOCHERTY  MD, MEGAN  [phone]) on 11/17/2014 4:06:25 PM      MDM   Final diagnoses:  Anemia, unspecified anemia type  Chest pain, unspecified chest pain type  Near syncope    Concerned for pe with lupus. Ct chest is negative. Discussed for with cardiology and her pcp as this is the second episode in a month. Pt verbalized understanding. No change in ekg noted    Glendell Docker, NP 11/17/14 1716  Ernestina Patches, MD 11/18/14 [phone]

## 2014-11-17 NOTE — Discharge Instructions (Signed)
Follow up with your cardiology as dicussed Chest Pain (Nonspecific) It is often hard to give a diagnosis for the cause of chest pain. There is always a chance that your pain could be related to something serious, such as a heart attack or a blood clot in the lungs. You need to follow up with your doctor. HOME CARE  If antibiotic medicine was given, take it as directed by your doctor. Finish the medicine even if you start to feel better.  For the next few days, avoid activities that bring on chest pain. Continue physical activities as told by your doctor.  Do not use any tobacco products. This includes cigarettes, chewing tobacco, and e-cigarettes.  Avoid drinking alcohol.  Only take medicine as told by your doctor.  Follow your doctor's suggestions for more testing if your chest pain does not go away.  Keep all doctor visits you made. GET HELP IF:  Your chest pain does not go away, even after treatment.  You have a rash with blisters on your chest.  You have a fever. GET HELP RIGHT AWAY IF:   You have more pain or pain that spreads to your arm, neck, jaw, back, or belly (abdomen).  You have shortness of breath.  You cough more than usual or cough up blood.  You have very bad back or belly pain.  You feel sick to your stomach (nauseous) or throw up (vomit).  You have very bad weakness.  You pass out (faint).  You have chills. This is an emergency. Do not wait to see if the problems will go away. Call your local emergency services (911 in [place] Do not drive yourself to the hospital. MAKE SURE YOU:   Understand these instructions.  Will watch your condition.  Will get help right away if you are not doing well or get worse. Document Released: 02/11/2008 Document Revised: 08/30/2013 Document Reviewed: 02/11/2008 [facility] Patient Information 2015 [place], [place]. This information is not intended to replace advice given to you by your health care provider. Make sure you  discuss any questions you have with your health care provider.

## 2018-07-14 ENCOUNTER — Ambulatory Visit: Payer: Self-pay | Admitting: Obstetrics and Gynecology

## 2018-07-31 ENCOUNTER — Emergency Department (HOSPITAL_COMMUNITY): Payer: 59

## 2018-07-31 ENCOUNTER — Other Ambulatory Visit: Payer: Self-pay

## 2018-07-31 ENCOUNTER — Encounter (HOSPITAL_COMMUNITY): Payer: Self-pay | Admitting: Emergency Medicine

## 2018-07-31 ENCOUNTER — Emergency Department (HOSPITAL_COMMUNITY)
Admission: EM | Admit: 2018-07-31 | Discharge: 2018-07-31 | Disposition: A | Discharge status: Home / Self Care | Payer: 59 | Attending: Emergency Medicine | Admitting: Emergency Medicine

## 2018-07-31 DIAGNOSIS — I10 Essential (primary) hypertension: Secondary | ICD-10-CM | POA: Diagnosis not present

## 2018-07-31 DIAGNOSIS — D259 Leiomyoma of uterus, unspecified: Secondary | ICD-10-CM

## 2018-07-31 DIAGNOSIS — K429 Umbilical hernia without obstruction or gangrene: Secondary | ICD-10-CM | POA: Insufficient documentation

## 2018-07-31 DIAGNOSIS — D649 Anemia, unspecified: Secondary | ICD-10-CM | POA: Diagnosis not present

## 2018-07-31 DIAGNOSIS — R109 Unspecified abdominal pain: Secondary | ICD-10-CM

## 2018-07-31 DIAGNOSIS — R1032 Left lower quadrant pain: Secondary | ICD-10-CM | POA: Diagnosis present

## 2018-07-31 LAB — URINALYSIS, ROUTINE W REFLEX MICROSCOPIC
Bilirubin Urine: NEGATIVE
Glucose, UA: NEGATIVE mg/dL
KETONES UR: NEGATIVE mg/dL
Nitrite: NEGATIVE
PROTEIN: NEGATIVE mg/dL
Specific Gravity, Urine: 1.008 (ref 1.005–1.030)
pH: 6 (ref 5.0–8.0)

## 2018-07-31 LAB — WET PREP, GENITAL
Clue Cells Wet Prep HPF POC: NONE SEEN
Sperm: NONE SEEN
Trich, Wet Prep: NONE SEEN
WBC, Wet Prep HPF POC: NONE SEEN
Yeast Wet Prep HPF POC: NONE SEEN

## 2018-07-31 LAB — COMPREHENSIVE METABOLIC PANEL
ALK PHOS: 60 U/L (ref 38–126)
ALT: 15 U/L (ref 0–44)
ANION GAP: 9 (ref 5–15)
AST: 20 U/L (ref 15–41)
Albumin: 4.1 g/dL (ref 3.5–5.0)
BILIRUBIN TOTAL: 0.5 mg/dL (ref 0.3–1.2)
BUN: 16 mg/dL (ref 6–20)
CALCIUM: 9.3 mg/dL (ref 8.9–10.3)
CO2: 25 mmol/L (ref 22–32)
Chloride: 101 mmol/L (ref 98–111)
Creatinine, Ser: 0.97 mg/dL (ref 0.44–1.00)
GFR calc Af Amer: >60 mL/min (ref >60)
Glucose, Bld: 115 mg/dL — ABNORMAL HIGH (ref 70–99)
Potassium: 4.2 mmol/L (ref 3.5–5.1)
Sodium: 135 mmol/L (ref 135–145)
TOTAL PROTEIN: 8.5 g/dL — AB (ref 6.5–8.1)

## 2018-07-31 LAB — I-STAT BETA HCG BLOOD, ED (MC, WL, AP ONLY): I-stat hCG, quantitative: <5 m[IU]/mL (ref <5)

## 2018-07-31 LAB — CBC
HCT: 35.7 % — ABNORMAL LOW (ref 36.0–46.0)
Hemoglobin: 10.2 g/dL — ABNORMAL LOW (ref 12.0–15.0)
MCH: 23.1 pg — ABNORMAL LOW (ref 26.0–34.0)
MCHC: 28.6 g/dL — ABNORMAL LOW (ref 30.0–36.0)
MCV: 81 fL (ref 80.0–100.0)
PLATELETS: 313 10*3/uL (ref 150–400)
RBC: 4.41 MIL/uL (ref 3.87–5.11)
RDW: 22.1 % — ABNORMAL HIGH (ref 11.5–15.5)
WBC: 12.1 10*3/uL — ABNORMAL HIGH (ref 4.0–10.5)
nRBC: 0 % (ref 0.0–0.2)

## 2018-07-31 LAB — LIPASE, BLOOD: Lipase: 30 U/L (ref 11–51)

## 2018-07-31 MED ORDER — ONDANSETRON HCL 4 MG/2ML IJ SOLN
4.0000 mg | Freq: Once | INTRAMUSCULAR | Status: AC
  Administered 2018-07-31: 4 mg via INTRAVENOUS
  Filled 2018-07-31: qty 2

## 2018-07-31 MED ORDER — SODIUM CHLORIDE 0.9 % IV BOLUS
500.0000 mL | Freq: Once | INTRAVENOUS | Status: AC
  Administered 2018-07-31: 500 mL via INTRAVENOUS

## 2018-07-31 MED ORDER — KETOROLAC TROMETHAMINE 15 MG/ML IJ SOLN
15.0000 mg | Freq: Once | INTRAMUSCULAR | Status: AC
  Administered 2018-07-31: 15 mg via INTRAVENOUS
  Filled 2018-07-31: qty 1

## 2018-07-31 NOTE — ED Notes (Addendum)
Ultrasound finished with tests

## 2018-07-31 NOTE — ED Provider Notes (Signed)
[facility] DEPT Provider Note   CSN: [id number] Arrival date & time: 07/31/18  0107     History   Chief Complaint Chief Complaint  Patient presents with  . Abdominal Pain    HPI Marjean Imperato is a 42 y.o. female with a hx of HTN, prior C section, anemia, and ovarian cysts who presents to the ED with complaints of abdominal pain which started approximately 12 hours prior. Patient states pain came on gradually in the left pelvic/LLQ area. This gradually worsened and started to radiate to R pelvic area and subsequently up to the periumbilical region. She states pain eased off after taking ibuprofen, she was able to go to sleep, and then returned again prompting ER visit. Currently discomfort is a 10/10. She reports pain feels somewhat similar to prior ovarian cysts. She also notes that she has had intermittent periumbilical pain for some time, but the lower abdominal pain feels different than this. She has had issues with constipation for about 1 week which she attributes to her iron pills, improvement after taking laxative, still passing gas. Currently on her cycle w/ vaginal bleeding. Has also had some nausea without vomiting. Denies fever, chills, vomiting, hematochezia, melena, dysuria, dyspnea, or chest pain.   HPI  Past Medical History:  Diagnosis Date  . Lupus [facility])     Patient Active Problem List   Diagnosis Date Noted  . Other malaise and fatigue 04/07/2014  . Weight gain 04/07/2014  . Prediabetes 04/07/2014  . Essential hypertension, benign 04/07/2014  . Joint pain 04/07/2014  . Family history of systemic lupus erythematosus 04/07/2014  . Unspecified constipation 04/07/2014  . ANA positive 04/07/2014    Past Surgical History:  Procedure Laterality Date  . CESAREAN SECTION       OB History   None      Home Medications    Prior to Admission medications   Medication Sig Start Date End Date Taking? Authorizing Provider  ferrous  sulfate 325 (65 FE) MG tablet Take 1 tablet (325 mg total) by mouth 2 (two) times daily with a meal. 10/30/14   Duffy Bruce, MD  ibuprofen (ADVIL,MOTRIN) 200 MG tablet Take 200 mg by mouth every 6 (six) hours as needed. For pain    [provider]    Family History Family History  Problem Relation Age of Onset  . Lupus Son   . Diabetes Father   . Stroke Sister     Social History Social History   Tobacco Use  . Smoking status: Never Smoker  . Smokeless tobacco: Never Used  Substance Use Topics  . Alcohol use: No  . Drug use: No     Allergies   Patient has no known allergies.   Review of Systems Review of Systems  Constitutional: Negative for chills and fever.  Respiratory: Negative for shortness of breath.   Cardiovascular: Negative for chest pain.  Gastrointestinal: Positive for abdominal pain, constipation and nausea. Negative for vomiting.  Genitourinary: Positive for vaginal bleeding (menstruating). Negative for dysuria and vaginal discharge.  All other systems reviewed and are negative.    Physical Exam Updated Vital Signs BP 129/77   Pulse 81   Temp 98.6 F (37 C) (Oral)   Resp 16   Ht 5\' 2"  (1.575 m)   Wt 104.3 kg   SpO2 99%   BMI 42.07 kg/m   Physical Exam  Constitutional: She appears well-developed and well-nourished.  Non-toxic appearance. No distress.  HENT:  Head: Normocephalic and atraumatic.  Eyes: Conjunctivae are normal. Right eye exhibits no discharge. Left eye exhibits no discharge.  Neck: Neck supple.  Cardiovascular: Normal rate and regular rhythm.  Pulmonary/Chest: Effort normal and breath sounds normal. No respiratory distress. She has no wheezes. She has no rhonchi. She has no rales.  Respiration even and unlabored  Abdominal: Soft. She exhibits no distension. There is tenderness in the right lower quadrant, periumbilical area, suprapubic area and left lower quadrant. There is no rigidity, no rebound, no guarding, no CVA  tenderness, no tenderness at McBurney's point and negative Murphy's sign. A hernia (Palpable umbilical hernia, reducible) is present.  Genitourinary: Pelvic exam was performed with patient supine. There is no tenderness on the right labia. Cervix exhibits no discharge and no friability. Right adnexum displays no mass and no fullness. Left adnexum displays no mass and no fullness. There is bleeding (minimal) in the vagina. No foreign body in the vagina. No vaginal discharge found.  Genitourinary Comments: Diffusely tender throughout exam.  RN Present as chaperone.   Neurological: She is alert.  Clear speech.   Skin: Skin is warm and dry. No rash noted.  Psychiatric: She has a normal mood and affect. Her behavior is normal.  Nursing note and vitals reviewed.    ED Treatments / Results  Labs (all labs ordered are listed, but only abnormal results are displayed) Labs Reviewed  COMPREHENSIVE METABOLIC PANEL - Abnormal; Notable for the following components:      Result Value   Glucose, Bld 115 (*)    Total Protein 8.5 (*)    All other components within normal limits  CBC - Abnormal; Notable for the following components:   WBC 12.1 (*)    Hemoglobin 10.2 (*)    HCT 35.7 (*)    MCH 23.1 (*)    MCHC 28.6 (*)    RDW 22.1 (*)    All other components within normal limits  URINALYSIS, ROUTINE W REFLEX MICROSCOPIC - Abnormal; Notable for the following components:   Hgb urine dipstick LARGE (*)    Leukocytes, UA SMALL (*)    RBC / HPF >50 (*)    Bacteria, UA RARE (*)    All other components within normal limits  WET PREP, GENITAL  LIPASE, BLOOD  I-STAT BETA HCG BLOOD, ED ([facility], [facility], [facility] ONLY)  GC/CHLAMYDIA PROBE AMP ([place]) NOT AT [facility]    EKG None  Radiology US Transvaginal Non-ob  Result Date: 07/31/2018 CLINICAL DATA:  Pelvic pain EXAM: TRANSABDOMINAL AND TRANSVAGINAL ULTRASOUND OF PELVIS DOPPLER ULTRASOUND OF OVARIES TECHNIQUE: Both transabdominal and transvaginal ultrasound  examinations of the pelvis were performed. Transabdominal technique was performed for global imaging of the pelvis including uterus, ovaries, adnexal regions, and pelvic cul-de-sac. It was necessary to proceed with endovaginal exam following the transabdominal exam to visualize the uterus and ovaries. Color and duplex Doppler ultrasound was utilized to evaluate blood flow to the ovaries. COMPARISON:  None. FINDINGS: Uterus Measurements: 15.8 x 8.1 x 11.4 cm = volume: 763 mL. Right anterior uterine body fibroid measures 5.8 x 5.4 x 4.6 cm. Posterior uterine fundal fibroid measures 4.3 x 4.0 x 4.7 cm. Left uterine body fibroid measures 6.8 x 6.5 x 5.6 cm. Endometrium Thickness: 6 mm.  No focal abnormality visualized. Right ovary Measurements: 3.9 x 2.0 x 3.3 cm = volume: 13.5 mL. Normal appearance. Left ovary Measurements: 4.1 x 1.8 x 2.4 cm = volume: 9.3 mL. Normal appearance. Pulsed Doppler evaluation of both ovaries demonstrates normal low-resistance arterial and venous waveforms.  Other findings No abnormal free fluid. IMPRESSION: 1. Enlarged fibroid uterus. 2. Normal ovaries with normal blood flow. Electronically Signed   By: Ulyses Jarred M.D.   On: 07/31/2018 05:18   US Pelvis Complete  Result Date: 07/31/2018 CLINICAL DATA:  Pelvic pain EXAM: TRANSABDOMINAL AND TRANSVAGINAL ULTRASOUND OF PELVIS DOPPLER ULTRASOUND OF OVARIES TECHNIQUE: Both transabdominal and transvaginal ultrasound examinations of the pelvis were performed. Transabdominal technique was performed for global imaging of the pelvis including uterus, ovaries, adnexal regions, and pelvic cul-de-sac. It was necessary to proceed with endovaginal exam following the transabdominal exam to visualize the uterus and ovaries. Color and duplex Doppler ultrasound was utilized to evaluate blood flow to the ovaries. COMPARISON:  None. FINDINGS: Uterus Measurements: 15.8 x 8.1 x 11.4 cm = volume: 763 mL. Right anterior uterine body fibroid measures 5.8 x 5.4  x 4.6 cm. Posterior uterine fundal fibroid measures 4.3 x 4.0 x 4.7 cm. Left uterine body fibroid measures 6.8 x 6.5 x 5.6 cm. Endometrium Thickness: 6 mm.  No focal abnormality visualized. Right ovary Measurements: 3.9 x 2.0 x 3.3 cm = volume: 13.5 mL. Normal appearance. Left ovary Measurements: 4.1 x 1.8 x 2.4 cm = volume: 9.3 mL. Normal appearance. Pulsed Doppler evaluation of both ovaries demonstrates normal low-resistance arterial and venous waveforms. Other findings No abnormal free fluid. IMPRESSION: 1. Enlarged fibroid uterus. 2. Normal ovaries with normal blood flow. Electronically Signed   By: Ulyses Jarred M.D.   On: 07/31/2018 05:18   [place] Art/ven Flow Abd Pelv Doppler  Result Date: 07/31/2018 CLINICAL DATA:  Pelvic pain EXAM: TRANSABDOMINAL AND TRANSVAGINAL ULTRASOUND OF PELVIS DOPPLER ULTRASOUND OF OVARIES TECHNIQUE: Both transabdominal and transvaginal ultrasound examinations of the pelvis were performed. Transabdominal technique was performed for global imaging of the pelvis including uterus, ovaries, adnexal regions, and pelvic cul-de-sac. It was necessary to proceed with endovaginal exam following the transabdominal exam to visualize the uterus and ovaries. Color and duplex Doppler ultrasound was utilized to evaluate blood flow to the ovaries. COMPARISON:  None. FINDINGS: Uterus Measurements: 15.8 x 8.1 x 11.4 cm = volume: 763 mL. Right anterior uterine body fibroid measures 5.8 x 5.4 x 4.6 cm. Posterior uterine fundal fibroid measures 4.3 x 4.0 x 4.7 cm. Left uterine body fibroid measures 6.8 x 6.5 x 5.6 cm. Endometrium Thickness: 6 mm.  No focal abnormality visualized. Right ovary Measurements: 3.9 x 2.0 x 3.3 cm = volume: 13.5 mL. Normal appearance. Left ovary Measurements: 4.1 x 1.8 x 2.4 cm = volume: 9.3 mL. Normal appearance. Pulsed Doppler evaluation of both ovaries demonstrates normal low-resistance arterial and venous waveforms. Other findings No abnormal free fluid. IMPRESSION: 1.  Enlarged fibroid uterus. 2. Normal ovaries with normal blood flow. Electronically Signed   By: Ulyses Jarred M.D.   On: 07/31/2018 05:18    Procedures Procedures (including critical care time)  Medications Ordered in ED Medications  ketorolac (TORADOL) 15 MG/ML injection 15 mg (15 mg Intravenous Given 07/31/18 0341)  ondansetron (ZOFRAN) injection 4 mg (4 mg Intravenous Given 07/31/18 0341)  sodium chloride 0.9 % bolus 500 mL (0 mLs Intravenous Stopped 07/31/18 0426)     Initial Impression / Assessment and Plan / ED Course  I have reviewed the triage vital signs and the nursing notes.  Pertinent labs & imaging results that were available during my care of the patient were reviewed by me and considered in my medical decision making (see chart for details).    Patient presents to the ED with complaints of  pelvic/abdominal pain. Per history it seems she has had intermittent periumbilical pain which is not new, and then today developed more lower abdominal/pelvic pain which worsened periumbilical pain. Patient nontoxic appearing, in no apparent distress, vitals WNL. On exam patient does have palpable umbilical hernia which is somewhat firm likely secondary to constipation but reducible, this area is tender, lower abdomen/suprapubic area is tender as well, no peritoneal signs. Bimanual exam with diffuse discomfort. Will evaluate with labs and ultrasound given pelvic/lower abdomen pain is new today. Analgesics, anti-emetics, and fluids administered.   Labs reviewed and grossly unremarkable. Nonspecific leukocytosis at 12.1. Anemia consistent with prior on record. No significant electrolyte disturbance. LFTs, renal function, and lipase WNL. Urinalysis without obvious infection, hematuria likely from vaginal bleeding w/ menses. Pregnancy test negative- doubt ectopic. Wet prep negative. GC/chlamydia tests pending, no significant discharge on exam, patient without STD concern- doubt PID.  Ultrasound w/  enlarged fibroid uterus, otherwise unremarkable. No ovarian cyst/torsion noted. On repeat abdominal exam patient remains without peritoneal signs, doubt cholecystitis, pancreatitis, diverticulitis, or appendicitis. She remains with umbilical hernia which is reducible with palpation, has had some constipation improved with laxatives and is passing gas, does not appear to be causing strangulation, obstruction, or perforation. Suspect multifactorial pain w/ hernia, constipation, and fibroids. Feel patient is safe for discharge home with PCP follow up, will also provide follow up for obgyn and general surgery. I discussed results, treatment plan, need for follow-up, and return precautions with the patient. Provided opportunity for questions, patient confirmed understanding and is in agreement with plan.   Findings and plan of care discussed with supervising physician Dr. Leonette Monarch who personally evaluated and examined this patient and is in agreement.    Final Clinical Impressions(s) / ED Diagnoses   Final diagnoses:  Abdominal pain, unspecified abdominal location  Umbilical hernia without obstruction and without gangrene  Uterine leiomyoma, unspecified location    ED Discharge Orders    None       Amaryllis Dyke, PA-C 07/31/18 0732    Fatima Blank, MD 07/31/18 [phone]

## 2018-07-31 NOTE — ED Triage Notes (Signed)
Patient presents with LLQ abdominal pain that radiates across her pelvis. Patient states she has ruptured an ovarian cyst before and the pain is the same. Patient says she has had some abnormal bleeding but none tonight.

## 2018-07-31 NOTE — ED Notes (Signed)
ED Provider at bedside. 

## 2018-07-31 NOTE — Discharge Instructions (Addendum)
You were seen in the emergency department today for abdominal pain.  Your work-up in the emergency department was overall reassuring.  Your labs show that your white blood cell count is slightly elevated at 12.1, this can be for a variety of reasons, this is something to have rechecked by her primary care provider.  Your hemoglobin is 10.2, this is consistent with prior hemoglobins consistent with your anemia.  Continue have this monitored by your primary care provider.  Your urine had some blood in it this is likely due to your menstrual cycle.   Ultrasound showed that you do have uterine fibroids, please see the attached handout regarding this diagnosis.  This is something that can be discussed with your OB/GYN, if you do not have an OB/GYN provider we have given you information in your discharge instructions to follow-up.  On exam you have a hernia, we suspect this is what has been causing the pain in your belly button area.  As discussed please gently apply pressure to persist back pain.  Also has discussed please start taking MiraLAX per over-the-counter dosing instructions to help with constipation.  With the hernia it is important that you are having regular bowel movements.  Hernias can be repaired electively, we have given you a general surgery's information to further discuss this.  He has can lead to emergency surgery should they become obstructed or lose blood flow

## 2018-08-02 LAB — GC/CHLAMYDIA PROBE AMP (~~LOC~~) NOT AT ARMC
Chlamydia: NEGATIVE
NEISSERIA GONORRHEA: NEGATIVE

## 2019-03-01 ENCOUNTER — Other Ambulatory Visit: Payer: Self-pay

## 2019-03-01 ENCOUNTER — Encounter (HOSPITAL_COMMUNITY): Payer: Self-pay

## 2019-03-01 ENCOUNTER — Emergency Department (HOSPITAL_COMMUNITY): Payer: 59

## 2019-03-01 ENCOUNTER — Emergency Department (HOSPITAL_COMMUNITY)
Admission: EM | Admit: 2019-03-01 | Discharge: 2019-03-01 | Disposition: A | Discharge status: Home / Self Care | Payer: 59 | Attending: Emergency Medicine | Admitting: Emergency Medicine

## 2019-03-01 DIAGNOSIS — R05 Cough: Secondary | ICD-10-CM | POA: Insufficient documentation

## 2019-03-01 DIAGNOSIS — I10 Essential (primary) hypertension: Secondary | ICD-10-CM | POA: Insufficient documentation

## 2019-03-01 DIAGNOSIS — R0602 Shortness of breath: Secondary | ICD-10-CM | POA: Insufficient documentation

## 2019-03-01 DIAGNOSIS — R42 Dizziness and giddiness: Secondary | ICD-10-CM | POA: Insufficient documentation

## 2019-03-01 DIAGNOSIS — R079 Chest pain, unspecified: Secondary | ICD-10-CM | POA: Insufficient documentation

## 2019-03-01 DIAGNOSIS — R110 Nausea: Secondary | ICD-10-CM | POA: Insufficient documentation

## 2019-03-01 DIAGNOSIS — Z79899 Other long term (current) drug therapy: Secondary | ICD-10-CM | POA: Insufficient documentation

## 2019-03-01 DIAGNOSIS — Z20828 Contact with and (suspected) exposure to other viral communicable diseases: Secondary | ICD-10-CM | POA: Insufficient documentation

## 2019-03-01 LAB — URINALYSIS, ROUTINE W REFLEX MICROSCOPIC
Bacteria, UA: NONE SEEN
Bilirubin Urine: NEGATIVE
Glucose, UA: NEGATIVE mg/dL
Hgb urine dipstick: NEGATIVE
Ketones, ur: NEGATIVE mg/dL
Nitrite: NEGATIVE
Protein, ur: NEGATIVE mg/dL
Specific Gravity, Urine: 1.014 (ref 1.005–1.030)
pH: 6 (ref 5.0–8.0)

## 2019-03-01 LAB — CBC
HCT: 32.1 % — ABNORMAL LOW (ref 36.0–46.0)
Hemoglobin: 9.1 g/dL — ABNORMAL LOW (ref 12.0–15.0)
MCH: 23.1 pg — ABNORMAL LOW (ref 26.0–34.0)
MCHC: 28.3 g/dL — ABNORMAL LOW (ref 30.0–36.0)
MCV: 81.5 fL (ref 80.0–100.0)
Platelets: 343 10*3/uL (ref 150–400)
RBC: 3.94 MIL/uL (ref 3.87–5.11)
RDW: 17.5 % — ABNORMAL HIGH (ref 11.5–15.5)
WBC: 4.7 10*3/uL (ref 4.0–10.5)
nRBC: 0 % (ref 0.0–0.2)

## 2019-03-01 LAB — BASIC METABOLIC PANEL
Anion gap: 10 (ref 5–15)
BUN: 11 mg/dL (ref 6–20)
CO2: 23 mmol/L (ref 22–32)
Calcium: 8.8 mg/dL — ABNORMAL LOW (ref 8.9–10.3)
Chloride: 107 mmol/L (ref 98–111)
Creatinine, Ser: 0.76 mg/dL (ref 0.44–1.00)
GFR calc Af Amer: >60 mL/min (ref >60)
GFR calc non Af Amer: >60 mL/min (ref >60)
Glucose, Bld: 109 mg/dL — ABNORMAL HIGH (ref 70–99)
Potassium: 3.9 mmol/L (ref 3.5–5.1)
Sodium: 140 mmol/L (ref 135–145)

## 2019-03-01 LAB — CBG MONITORING, ED: Glucose-Capillary: 92 mg/dL (ref 70–99)

## 2019-03-01 LAB — D-DIMER, QUANTITATIVE: D-Dimer, Quant: 0.81 ug/mL-FEU — ABNORMAL HIGH (ref 0.00–0.50)

## 2019-03-01 LAB — TROPONIN I (HIGH SENSITIVITY): Troponin I (High Sensitivity): 3.4 ng/L (ref <18)

## 2019-03-01 LAB — I-STAT BETA HCG BLOOD, ED (MC, WL, AP ONLY): I-stat hCG, quantitative: <5 m[IU]/mL (ref <5)

## 2019-03-01 MED ORDER — SODIUM CHLORIDE (PF) 0.9 % IJ SOLN
INTRAMUSCULAR | Status: AC
  Administered 2019-03-01: 18:00:00
  Filled 2019-03-01: qty 50

## 2019-03-01 MED ORDER — SODIUM CHLORIDE 0.9% FLUSH
3.0000 mL | Freq: Once | INTRAVENOUS | Status: AC
  Administered 2019-03-01: 3 mL via INTRAVENOUS

## 2019-03-01 MED ORDER — SODIUM CHLORIDE 0.9 % IV BOLUS
1000.0000 mL | Freq: Once | INTRAVENOUS | Status: AC
  Administered 2019-03-01: 1000 mL via INTRAVENOUS

## 2019-03-01 MED ORDER — IOHEXOL 350 MG/ML SOLN
100.0000 mL | Freq: Once | INTRAVENOUS | Status: AC | PRN
  Administered 2019-03-01: 100 mL via INTRAVENOUS

## 2019-03-01 NOTE — Discharge Instructions (Signed)
You are being tested for the novel coronavirus. You need self quarantine until this results.

## 2019-03-01 NOTE — ED Triage Notes (Addendum)
Patient states Sunday she was not "feeling well".   Patient states Monday she was off of work and rested and still didn't feel right.     Patient states today she still feels off and called her job and they told her they would test her today for covid.   Patient gets up this morning and felt dizzy and shob and thought she was going to pass out and decided to come to ED.   Denies shob in triage.   Patient states she took covid test in may and it was negative.     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   A/ox4 Ambulatory in triage.   Denies pain.   Patient states she ate breakfast.

## 2019-03-01 NOTE — ED Notes (Signed)
Patient given discharge teaching and verbalized understanding. Patient ambulated out of ED with a steady gait. 

## 2019-03-01 NOTE — ED Provider Notes (Addendum)
[facility] DEPT Provider Note   CSN: [id number] Arrival date & time: 03/01/19  1141    History   Chief Complaint Chief Complaint  Patient presents with  . Dizziness  . Shortness of Breath  . Covid rule out    HPI Diane Ramsey is a 43 y.o. female.  HPI  43 year old female presents with shortness of breath and dizziness.  She has been feeling "off" for several days.  She states for the past week or so she has been having chest pain when exercising.  She is not sure if this is because she is exercising for the first time in a while.  She has not had any chest pain for the last couple days.  However she has been having shortness of breath since yesterday.  She has a hard time describing how she feels otherwise besides saying that she is "off".  There have been COVID-19 contacts where she works and she was due to get a test today but when she stood up to get dressed she felt acutely lightheaded.  Because of this she presented to the ER.  She is had a little bit of a cough since yesterday.  Temp of 101 today.  Currently still feels short of breath.  Was nauseated earlier but this resolved.  No current headache or abdominal pain.  Past Medical History:  Diagnosis Date  . Lupus [facility])     Patient Active Problem List   Diagnosis Date Noted  . Other malaise and fatigue 04/07/2014  . Weight gain 04/07/2014  . Prediabetes 04/07/2014  . Essential hypertension, benign 04/07/2014  . Joint pain 04/07/2014  . Family history of systemic lupus erythematosus 04/07/2014  . Unspecified constipation 04/07/2014  . ANA positive 04/07/2014    Past Surgical History:  Procedure Laterality Date  . CESAREAN SECTION       OB History   No obstetric history on file.      Home Medications    Prior to Admission medications   Medication Sig Start Date End Date Taking? Authorizing Provider  ferrous sulfate 325 (65 FE) MG tablet Take 1 tablet (325 mg total) by mouth 2  (two) times daily with a meal. 10/30/14   Duffy Bruce, MD  ibuprofen (ADVIL,MOTRIN) 200 MG tablet Take 200 mg by mouth every 6 (six) hours as needed for mild pain. For pain     [provider]  triamterene-hydrochlorothiazide (MAXZIDE-25) 37.5-25 MG tablet Take 1 tablet by mouth every morning. 07/08/18   [provider]    Family History Family History  Problem Relation Age of Onset  . Lupus Son   . Diabetes Father   . Stroke Sister     Social History Social History   Tobacco Use  . Smoking status: Never Smoker  . Smokeless tobacco: Never Used  Substance Use Topics  . Alcohol use: No  . Drug use: No     Allergies   Patient has no known allergies.   Review of Systems Review of Systems  Constitutional: Positive for fever.  Respiratory: Positive for cough and shortness of breath.   Cardiovascular: Positive for chest pain. Negative for leg swelling.  Gastrointestinal: Negative for abdominal pain.  Genitourinary: Negative for dysuria.  Neurological: Positive for light-headedness. Negative for headaches.  All other systems reviewed and are negative.    Physical Exam Updated Vital Signs BP (!) 159/101 (BP Location: Left Arm)   Pulse 77   Temp 99.2 F (37.3 C) (Oral)   Resp  12   LMP 02/20/2019   SpO2 100%   Physical Exam Vitals signs and nursing note reviewed.  Constitutional:      General: She is not in acute distress.    Appearance: She is well-developed. She is obese. She is not ill-appearing or diaphoretic.  HENT:     Head: Normocephalic and atraumatic.     Right Ear: External ear normal.     Left Ear: External ear normal.     Nose: Nose normal.  Eyes:     General:        Right eye: No discharge.        Left eye: No discharge.  Cardiovascular:     Rate and Rhythm: Normal rate and regular rhythm.     Heart sounds: Normal heart sounds.  Pulmonary:     Effort: Pulmonary effort is normal. Tachypnea (mild, when talking) present. No  accessory muscle usage.     Breath sounds: Normal breath sounds. No decreased breath sounds, wheezing, rhonchi or rales.  Abdominal:     Palpations: Abdomen is soft.     Tenderness: There is no abdominal tenderness.  Musculoskeletal:     Right lower leg: No edema.     Left lower leg: No edema.  Skin:    General: Skin is warm and dry.  Neurological:     Mental Status: She is alert.  Psychiatric:        Mood and Affect: Mood is not anxious.      ED Treatments / Results  Labs (all labs ordered are listed, but only abnormal results are displayed) Labs Reviewed  BASIC METABOLIC PANEL - Abnormal; Notable for the following components:      Result Value   Glucose, Bld 109 (*)    Calcium 8.8 (*)    All other components within normal limits  CBC - Abnormal; Notable for the following components:   Hemoglobin 9.1 (*)    HCT 32.1 (*)    MCH 23.1 (*)    MCHC 28.3 (*)    RDW 17.5 (*)    All other components within normal limits  URINALYSIS, ROUTINE W REFLEX MICROSCOPIC - Abnormal; Notable for the following components:   Leukocytes,Ua MODERATE (*)    All other components within normal limits  D-DIMER, QUANTITATIVE (NOT AT [facility]) - Abnormal; Notable for the following components:   D-Dimer, Quant 0.81 (*)    All other components within normal limits  NOVEL CORONAVIRUS, NAA (HOSPITAL ORDER, SEND-OUT TO REF LAB)  TROPONIN I (HIGH SENSITIVITY)  CBG MONITORING, ED  I-STAT BETA HCG BLOOD, ED ([facility], [facility], [facility] ONLY)    EKG EKG Interpretation  Date/Time:  Tuesday March 01 2019 11:52:19 EDT Ventricular Rate:  84 PR Interval:    QRS Duration: 102 QT Interval:  387 QTC Calculation: 458 R Axis:   22 Text Interpretation:  Sinus rhythm RSR' in V1 or V2, probably normal variant Borderline T abnormalities, anterior leads T wave changes similar to Mar 2016 Confirmed by Sherwood Gambler [phone]) on 03/01/2019 2:11:18 PM   Radiology Ct Angio Chest Pe W And/or Wo Contrast  Result Date: 03/01/2019  CLINICAL DATA:  Midsternal chest pain with shortness of breath, history of lupus EXAM: CT ANGIOGRAPHY CHEST WITH CONTRAST TECHNIQUE: Multidetector CT imaging of the chest was performed using the standard protocol during bolus administration of intravenous contrast. Multiplanar CT image reconstructions and MIPs were obtained to evaluate the vascular anatomy. CONTRAST:  142mL OMNIPAQUE IOHEXOL 350 MG/ML SOLN COMPARISON:  11/17/2014 FINDINGS: Cardiovascular: Satisfactory  opacification of the pulmonary arteries to the segmental level. No evidence of pulmonary embolism. Normal heart size. No pericardial effusion. Mediastinum/Nodes: No enlarged mediastinal, hilar, or axillary lymph nodes. Thyroid gland, trachea, and esophagus demonstrate no significant findings. Lungs/Pleura: 3 mm pulmonary nodule of the left upper lobe (series 10, image 36). No pleural effusion or pneumothorax. Upper Abdomen: No acute abnormality. Musculoskeletal: No chest wall abnormality. No acute or significant osseous findings. Review of the MIP images confirms the above findings. IMPRESSION: 1.  Negative examination for pulmonary embolism. 2. There is a nonspecific 3 mm pulmonary nodule of the left upper lobe (series 10, image 36). CT follow-up of this nodule is optional at 12 months if high risk factors for lung cancer are present. Electronically Signed   By: Eddie Candle M.D.   On: 03/01/2019 18:22   Dg Chest Portable 1 View  Result Date: 03/01/2019 CLINICAL DATA:  Dizziness, shortness of breath EXAM: PORTABLE CHEST 1 VIEW COMPARISON:  11/17/2014 FINDINGS: Mild cardiomegaly. Both lungs are clear. The visualized skeletal structures are unremarkable. IMPRESSION: Mild cardiomegaly without acute abnormality of the lungs in AP portable projection. Electronically Signed   By: Eddie Candle M.D.   On: 03/01/2019 15:50    Procedures Procedures (including critical care time)  Medications Ordered in ED Medications  sodium chloride flush (NS) 0.9  % injection 3 mL (3 mLs Intravenous Given 03/01/19 1513)  sodium chloride 0.9 % bolus 1,000 mL (0 mLs Intravenous Stopped 03/01/19 1857)  iohexol (OMNIPAQUE) 350 MG/ML injection 100 mL (100 mLs Intravenous Contrast Given 03/01/19 1742)  sodium chloride (PF) 0.9 % injection (  Given by Other 03/01/19 1744)     Initial Impression / Assessment and Plan / ED Course  I have reviewed the triage vital signs and the nursing notes.  Pertinent labs & imaging results that were available during my care of the patient were reviewed by me and considered in my medical decision making (see chart for details).     HEAR Score: 2  No clear cause for the patient overall feeling poorly.  Given the on and off chest pain and now the shortness of breath, troponin sent.  Per protocol with HEAR score of 2 and troponin at 3.4, no further testing indicated as ACS is highly unlikely.  She was worked up for PE and this is negative.  Also no obvious other findings such as pneumonia/atypical pneumonia.  Labs are overall reassuring.  No urinary symptoms in the urine taken appears contaminated.  She feels a little better with fluids.  Likely she has overexerted herself recently with work but given the current pandemic will test in an outpatient fashion for the novel coronavirus.  Discussed return precautions.  I verbally discussed the nodule seen on CT and her need for outpatient follow-up.  Diane Ramsey was evaluated in Emergency Department on 03/01/2019 for the symptoms described in the history of present illness. She was evaluated in the context of the global COVID-19 pandemic, which necessitated consideration that the patient might be at risk for infection with the SARS-CoV-2 virus that causes COVID-19. Institutional protocols and algorithms that pertain to the evaluation of patients at risk for COVID-19 are in a state of rapid change based on information released by regulatory bodies including the CDC and federal and state  organizations. These policies and algorithms were followed during the patient's care in the ED.   Final Clinical Impressions(s) / ED Diagnoses   Final diagnoses:  Shortness of breath    ED Discharge  Orders    None       Sherwood Gambler, MD 03/01/19 1950    Sherwood Gambler, MD 03/01/19 [phone]

## 2019-03-03 LAB — NOVEL CORONAVIRUS, NAA (HOSP ORDER, SEND-OUT TO REF LAB; TAT 18-24 HRS): SARS-CoV-2, NAA: NOT DETECTED

## 2019-07-14 ENCOUNTER — Encounter (HOSPITAL_COMMUNITY): Payer: Self-pay

## 2019-07-14 ENCOUNTER — Emergency Department (HOSPITAL_COMMUNITY)
Admission: EM | Admit: 2019-07-14 | Discharge: 2019-07-14 | Disposition: A | Discharge status: Home / Self Care | Payer: 59 | Attending: Emergency Medicine | Admitting: Emergency Medicine

## 2019-07-14 ENCOUNTER — Emergency Department (HOSPITAL_COMMUNITY): Payer: 59

## 2019-07-14 ENCOUNTER — Other Ambulatory Visit: Payer: Self-pay

## 2019-07-14 DIAGNOSIS — W010XXD Fall on same level from slipping, tripping and stumbling without subsequent striking against object, subsequent encounter: Secondary | ICD-10-CM | POA: Insufficient documentation

## 2019-07-14 DIAGNOSIS — R600 Localized edema: Secondary | ICD-10-CM | POA: Insufficient documentation

## 2019-07-14 DIAGNOSIS — I10 Essential (primary) hypertension: Secondary | ICD-10-CM | POA: Insufficient documentation

## 2019-07-14 DIAGNOSIS — Z79899 Other long term (current) drug therapy: Secondary | ICD-10-CM | POA: Diagnosis not present

## 2019-07-14 DIAGNOSIS — M79671 Pain in right foot: Secondary | ICD-10-CM | POA: Diagnosis present

## 2019-07-14 MED ORDER — NAPROXEN 500 MG PO TABS: 500.0000 mg | ORAL_TABLET | Freq: Two times a day (BID) | ORAL | 0 refills | Status: DC

## 2019-07-14 NOTE — ED Triage Notes (Signed)
Pt presents with c/o right foot pain that started approx 2 weeks ago. Pt reports she had tripped at work and felt a pain in the top of her foot at this time. Ambulatory to triage.

## 2019-07-14 NOTE — ED Provider Notes (Signed)
[facility] DEPT Provider Note   CSN: [id number] Arrival date & time: 07/14/19  1139     History   Chief Complaint Chief Complaint  Patient presents with  . Foot Pain    HPI Diane Ramsey is a 43 y.o. female presenting for evaluation of right foot pain.  Patient states 4 weeks ago she tripped at work.  Since then, she has been having gradually worsening pain of her right foot.  Pain is in the lateral aspect, near where she broke her foot previously.  Patient states the pain was so bad today that she had difficulty walking.  Patient states pain is worse when she first wakes up in the morning, improves slightly throughout the day.  She took Tylenol this morning without improvement of symptoms.  She has not tried anything else.  She denies numbness or tingling.  She denies pain elsewhere.  She has a history of lupus, not currently taking anything for this.     HPI  Past Medical History:  Diagnosis Date  . Lupus [facility])     Patient Active Problem List   Diagnosis Date Noted  . Other malaise and fatigue 04/07/2014  . Weight gain 04/07/2014  . Prediabetes 04/07/2014  . Essential hypertension, benign 04/07/2014  . Joint pain 04/07/2014  . Family history of systemic lupus erythematosus 04/07/2014  . Unspecified constipation 04/07/2014  . ANA positive 04/07/2014    Past Surgical History:  Procedure Laterality Date  . CESAREAN SECTION       OB History   No obstetric history on file.      Home Medications    Prior to Admission medications   Medication Sig Start Date End Date Taking? Authorizing Provider  ferrous sulfate 325 (65 FE) MG tablet Take 1 tablet (325 mg total) by mouth 2 (two) times daily with a meal. 10/30/14   Duffy Bruce, MD  ibuprofen (ADVIL,MOTRIN) 200 MG tablet Take 200 mg by mouth every 6 (six) hours as needed for mild pain. For pain     [provider]  naproxen (NAPROSYN) 500 MG tablet Take 1 tablet (500 mg total)  by mouth 2 (two) times daily with a meal. 07/14/19   Zalika Tieszen, PA-C  triamterene-hydrochlorothiazide (MAXZIDE-25) 37.5-25 MG tablet Take 1 tablet by mouth every morning. 07/08/18   [provider]    Family History Family History  Problem Relation Age of Onset  . Lupus Son   . Diabetes Father   . Stroke Sister     Social History Social History   Tobacco Use  . Smoking status: Never Smoker  . Smokeless tobacco: Never Used  Substance Use Topics  . Alcohol use: No  . Drug use: No     Allergies   Patient has no known allergies.   Review of Systems Review of Systems  Musculoskeletal: Positive for arthralgias.  Neurological: Negative for numbness.     Physical Exam Updated Vital Signs BP (!) 165/93   Pulse 86   Temp 98.4 F (36.9 C) (Oral)   Resp 16   Ht 5\' 2"  (1.575 m)   Wt 113.4 kg   LMP 07/14/2019 (Approximate)   SpO2 94%   BMI 45.73 kg/m   Physical Exam Vitals signs and nursing note reviewed.  Constitutional:      General: She is not in acute distress.    Appearance: She is well-developed.     Comments: Sitting comfortably in the bed no acute distress  HENT:  Head: Normocephalic and atraumatic.  Neck:     Musculoskeletal: Normal range of motion.  Pulmonary:     Effort: Pulmonary effort is normal.  Abdominal:     General: There is no distension.  Musculoskeletal:        General: Swelling and tenderness present.     Comments: Mild swelling of the right lateral ankle/foot.  Tenderness palpation over the distal lateral malleolus and reviewed lateral foot.  No erythema or warmth.  Increased pain with eversion and plantarflexion, no pain with dorsiflexion.  Pedal pulses intact bilaterally.  Good distal cap refill.  Full active motion of the toes without pain.  No pain in the distal lower leg.  No calf pain.    Skin:    General: Skin is warm.     Capillary Refill: Capillary refill takes less than 2 seconds.     Findings: No rash.   Neurological:     Mental Status: She is alert and oriented to person, place, and time.      ED Treatments / Results  Labs (all labs ordered are listed, but only abnormal results are displayed) Labs Reviewed - No data to display  EKG None  Radiology Dg Foot Complete Right  Result Date: 07/14/2019 CLINICAL DATA:  Pain following fall EXAM: RIGHT FOOT COMPLETE - 3+ VIEW COMPARISON:  None. FINDINGS: Frontal, oblique, and lateral views obtained. There is no fracture or dislocation. There is slight narrowing of the first MTP joint. There are cystic areas in the distal first metatarsal but no frank erosion. Other joint spaces appear normal. There is a small posterior calcaneal spur. There is an accessory ossicle lateral to the cuboid. IMPRESSION: No fracture or dislocation. 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 There are cystic areas in the distal first metatarsal without frank erosion. Other joint spaces appear unremarkable. There is a small posterior calcaneal spur. Electronically Signed   By: Lowella Grip III M.D.   On: 07/14/2019 13:30    Procedures Procedures (including critical care time)  Medications Ordered in ED Medications - No data to display   Initial Impression / Assessment and Plan / ED Course  I have reviewed the triage vital signs and the nursing notes.  Pertinent labs & imaging results that were available during my care of the patient were reviewed by me and considered in my medical decision making (see chart for details).        Patient presenting for evaluation of right foot pain.  Physical examination, she appears nontoxic.  As pain as been worsening over several weeks, will obtain x-rays to r/o bony abnormlaity.  X-rays viewed interpreted by me, no fracture dislocation.  There is a sesamoid bone near where patient is having pain.  Pain is mostly over the ATLF, likely ligamentous injury/irritation. discussed symptomatic tx with  nsaids and brace. Follow up with podiatry if sxs are not improving. At this time, pt appears safe for d/c. return precautions given. Pt states she understands and agrees to plan.    Final Clinical Impressions(s) / ED Diagnoses   Final diagnoses:  Right foot pain    ED Discharge Orders         Ordered    naproxen (NAPROSYN) 500 MG tablet  2 times daily with meals     07/14/19 1421           Makell Drohan, PA-C 07/14/19 2000    Fredia Sorrow, MD 07/19/19 1811

## 2019-07-14 NOTE — Discharge Instructions (Signed)
Take naproxen 2 times a day with meals.  Do not take other anti-inflammatories at the same time (Advil, Motrin, ibuprofen, Aleve). You may supplement with Tylenol if you need further pain control. Use the brace when walking. Do not wear at night.  I recommend gentle stretching and movement when you first wake up to decrease pain when you stand.  Follow up with the foot doctor listed below in 1 week if your pain is not improving.  Return to the ER with any new, worsening, or concerning symptoms.

## 2019-07-21 ENCOUNTER — Encounter: Payer: Self-pay | Admitting: Podiatry

## 2019-07-21 ENCOUNTER — Ambulatory Visit (INDEPENDENT_AMBULATORY_CARE_PROVIDER_SITE_OTHER): Payer: 59

## 2019-07-21 ENCOUNTER — Ambulatory Visit (INDEPENDENT_AMBULATORY_CARE_PROVIDER_SITE_OTHER): Payer: 59 | Admitting: Podiatry

## 2019-07-21 ENCOUNTER — Ambulatory Visit: Payer: 59 | Admitting: Podiatry

## 2019-07-21 ENCOUNTER — Other Ambulatory Visit: Payer: Self-pay

## 2019-07-21 ENCOUNTER — Other Ambulatory Visit: Payer: Self-pay | Admitting: Podiatry

## 2019-07-21 VITALS — BP 151/98 | HR 79 | Resp 16

## 2019-07-21 DIAGNOSIS — M779 Enthesopathy, unspecified: Secondary | ICD-10-CM

## 2019-07-21 DIAGNOSIS — S93601A Unspecified sprain of right foot, initial encounter: Secondary | ICD-10-CM

## 2019-07-21 DIAGNOSIS — M25571 Pain in right ankle and joints of right foot: Secondary | ICD-10-CM | POA: Diagnosis not present

## 2019-07-21 MED ORDER — DICLOFENAC SODIUM 75 MG PO TBEC: 75.0000 mg | DELAYED_RELEASE_TABLET | Freq: Two times a day (BID) | ORAL | 2 refills | Status: DC

## 2019-07-21 NOTE — Progress Notes (Signed)
   Subjective:    Patient ID: Diane Ramsey, female    DOB: 1975/12/09, 43 y.o.   MRN: [id number]  HPI    Review of Systems  All other systems reviewed and are negative.      Objective:   Physical Exam        Assessment & Plan:

## 2019-07-25 NOTE — Progress Notes (Signed)
Subjective:   Patient ID: Diane Ramsey, female   DOB: 43 y.o.   MRN: [id number]   HPI Patient presents stating having a lot of pain in the right ankle and also having pain in the right foot that has been going on now for about a month.  Patient went to urgent care and they did not see anything but she is having trouble bearing weight and states that it feels like something is broken.  Patient does not smoke likes to be active   Review of Systems  All other systems reviewed and are negative.       Objective:  Physical Exam Vitals signs and nursing note reviewed.  Constitutional:      Appearance: She is well-developed.  Pulmonary:     Effort: Pulmonary effort is normal.  Musculoskeletal: Normal range of motion.  Skin:    General: Skin is warm.  Neurological:     Mental Status: She is alert.     Neurovascular status intact muscle strength found to be adequate range of motion within normal limits with patient noted to have exquisite discomfort in the right sinus tarsi with inflammation fluid around this area.  Also has discomfort across the entire midfoot and into the moderate medial side where she may be walking abnormally and seems that it is gradually becoming more of an issue for her.  Patient is good perfusion well oriented x3     Assessment:  Probability for some kind of inflammatory condition with possibility for bone injury or other arthritic pathology     Plan:  H&P reviewed all conditions and the different symptoms she is experiencing.  At this point I did do a sinus tarsi injection right 3 mg Kenalog 5 mg Xylocaine advised on immobilization and applied air fracture walker to completely and immobilize the foot and ankle.  Patient tolerated this well will be seen back for [place] to recheck and is advised on reduced activity with gradual increase in activity in the next 3 weeks  X-rays indicate that there is no signs of fracture no signs of bone injury currently but difficult to  make decision as to whether or not there may be stress fracture

## 2019-07-28 ENCOUNTER — Other Ambulatory Visit: Payer: Self-pay

## 2019-07-28 ENCOUNTER — Emergency Department (HOSPITAL_COMMUNITY)
Admission: EM | Admit: 2019-07-28 | Discharge: 2019-07-28 | Disposition: A | Discharge status: Home / Self Care | Payer: 59 | Attending: Emergency Medicine | Admitting: Emergency Medicine

## 2019-07-28 ENCOUNTER — Encounter (HOSPITAL_COMMUNITY): Payer: Self-pay

## 2019-07-28 DIAGNOSIS — I10 Essential (primary) hypertension: Secondary | ICD-10-CM | POA: Insufficient documentation

## 2019-07-28 DIAGNOSIS — Z20822 Contact with and (suspected) exposure to covid-19: Secondary | ICD-10-CM

## 2019-07-28 DIAGNOSIS — Z79899 Other long term (current) drug therapy: Secondary | ICD-10-CM | POA: Diagnosis not present

## 2019-07-28 DIAGNOSIS — L930 Discoid lupus erythematosus: Secondary | ICD-10-CM | POA: Insufficient documentation

## 2019-07-28 DIAGNOSIS — B349 Viral infection, unspecified: Secondary | ICD-10-CM | POA: Insufficient documentation

## 2019-07-28 DIAGNOSIS — Z20828 Contact with and (suspected) exposure to other viral communicable diseases: Secondary | ICD-10-CM | POA: Diagnosis present

## 2019-07-28 MED ORDER — CETIRIZINE HCL 10 MG PO TABS: 10.0000 mg | ORAL_TABLET | Freq: Every day | ORAL | 0 refills | Status: DC

## 2019-07-28 MED ORDER — FLUTICASONE PROPIONATE 50 MCG/ACT NA SUSP: 1.0000 | Freq: Every day | NASAL | 1 refills | Status: DC

## 2019-07-28 MED ORDER — GUAIFENESIN-DM 100-10 MG/5ML PO SYRP: 5.0000 mL | ORAL_SOLUTION | ORAL | 0 refills | Status: DC | PRN

## 2019-07-28 NOTE — ED Notes (Signed)
Pt verbalized discharge instructions and follow up care. Alert and ambulatory. No IV. Vitals stable.

## 2019-07-28 NOTE — ED Provider Notes (Signed)
[facility] DEPT Provider Note   CSN: [id number] Arrival date & time: 07/28/19  1801     History   Chief Complaint Chief Complaint  Patient presents with  . COVID Exposure    HPI Diane Ramsey is a 43 y.o. female history of hypertension, obesity, prediabetes     HPI  [place] today for 1 week of cough, congestion, fatigue.  Patient states she has positive Covid exposure and works at [employer] with several Covid positive patients live.  Patient states she has used Mucinex twice with good improvement in symptoms however states that the coughing and phlegm continues to return.  Patient states she has taken no Tylenol or ibuprofen.  Denies any other attempted remedies.  Patient states cough is productive of clear phlegm.  Denies sore throat, fever, difficulty breathing, endorses occasional cough and difficulty breathing.  Feels burning chest pain with intense coughing but denies any other chest pain is exertional or otherwise.  Denies any abdominal pain, diarrhea, endorses brief period of a no dyspnea denies dysgeusia.  Patient states that she presents primarily for symptom control as she states that she is frustrated that her symptoms have been consistent for 1 year.  Patient does endorse several episodes of lightheadedness which she describes as feeling weak when she stands up.  Denies any near syncope.  Past Medical History:  Diagnosis Date  . Lupus [facility])     Patient Active Problem List   Diagnosis Date Noted  . Other malaise and fatigue 04/07/2014  . Weight gain 04/07/2014  . Prediabetes 04/07/2014  . Essential hypertension, benign 04/07/2014  . Joint pain 04/07/2014  . Family history of systemic lupus erythematosus 04/07/2014  . Unspecified constipation 04/07/2014  . ANA positive 04/07/2014    Past Surgical History:  Procedure Laterality Date  . CESAREAN SECTION       OB History   No obstetric history on file.      Home  Medications    Prior to Admission medications   Medication Sig Start Date End Date Taking? Authorizing Provider  cetirizine (ZYRTEC ALLERGY) 10 MG tablet Take 1 tablet (10 mg total) by mouth daily for 14 days. 07/28/19 08/11/19  Tedd Sias, PA  diclofenac (VOLTAREN) 75 MG EC tablet Take 1 tablet (75 mg total) by mouth 2 (two) times daily. 07/21/19   Wallene Huh, DPM  ferrous sulfate 325 (65 FE) MG tablet Take 1 tablet (325 mg total) by mouth 2 (two) times daily with a meal. 10/30/14   Duffy Bruce, MD  fluticasone (FLONASE) 50 MCG/ACT nasal spray Place 1 spray into both nostrils daily. 07/28/19   Arnold Depinto, Kathleene Hazel, PA  guaiFENesin-dextromethorphan (ROBITUSSIN DM) 100-10 MG/5ML syrup Take 5 mLs by mouth every 4 (four) hours as needed for cough. 07/28/19   Tedd Sias, PA  ibuprofen (ADVIL,MOTRIN) 200 MG tablet Take 200 mg by mouth every 6 (six) hours as needed for mild pain. For pain     [provider]  naproxen (NAPROSYN) 500 MG tablet Take 1 tablet (500 mg total) by mouth 2 (two) times daily with a meal. 07/14/19   Caccavale, Sophia, PA-C  triamterene-hydrochlorothiazide (MAXZIDE-25) 37.5-25 MG tablet Take 1 tablet by mouth every morning. 07/08/18   [provider]    Family History Family History  Problem Relation Age of Onset  . Lupus Son   . Diabetes Father   . Stroke Sister     Social History Social History   Tobacco Use  .  Smoking status: Never Smoker  . Smokeless tobacco: Never Used  Substance Use Topics  . Alcohol use: No  . Drug use: No     Allergies   Patient has no known allergies.   Review of Systems Review of Systems  Constitutional: Negative for chills and fever.  HENT: Positive for congestion.   Respiratory: Positive for cough and chest tightness.   Cardiovascular: Positive for chest pain. Negative for palpitations and leg swelling.  Gastrointestinal: Negative for abdominal pain.  Musculoskeletal: Positive for myalgias.  Negative for neck pain.  Neurological: Positive for light-headedness.  All other systems reviewed and are negative.    Physical Exam Updated Vital Signs BP (!) 153/102   Temp 99.5 F (37.5 C) (Oral)   Resp 20   LMP 07/14/2019 (Approximate)   SpO2 99%   Physical Exam Vitals signs and nursing note reviewed.  Constitutional:      General: She is not in acute distress. HENT:     Head: Normocephalic and atraumatic.     Nose: Nose normal.  Eyes:     General: No scleral icterus. Neck:     Musculoskeletal: Normal range of motion.  Cardiovascular:     Rate and Rhythm: Normal rate and regular rhythm.     Pulses: Normal pulses.     Heart sounds: Normal heart sounds.  Pulmonary:     Effort: Pulmonary effort is normal. No respiratory distress.     Breath sounds: Rhonchi (Clear with cough) present.  Abdominal:     Palpations: Abdomen is soft.     Tenderness: There is no abdominal tenderness.  Musculoskeletal:     Right lower leg: No edema.     Left lower leg: No edema.  Skin:    General: Skin is warm and dry.     Capillary Refill: Capillary refill takes less than 2 seconds.  Neurological:     Mental Status: She is alert. Mental status is at baseline.  Psychiatric:        Mood and Affect: Mood normal.        Behavior: Behavior normal.      ED Treatments / Results  Labs (all labs ordered are listed, but only abnormal results are displayed) Labs Reviewed - No data to display  EKG None  Radiology No results found.  Procedures Procedures (including critical care time)  Medications Ordered in ED Medications - No data to display   Initial Impression / Assessment and Plan / ED Course  I have reviewed the triage vital signs and the nursing notes.  Pertinent labs & imaging results that were available during my care of the patient were reviewed by me and considered in my medical decision making (see chart for details).        Patient is well-appearing 43 year old  female with no pertinent medical history presenting today for possible Covid exposure cough, congestion, body aches, fatigue, occasional lightheadedness and general malaise.  She has Covid test pending at outside clinic.  No indication for chest x-ray as lungs are clear -- no need for additional testing at this time.  Doubt pneumonia patient is afebrile, lungs sound clear, she is well-appearing, nontachycardic during my examination.  Also during examination patient is 100 O2 saturation on room air.  I personally ambulated patient in room and her O2 saturation dropped to 99%.  She does not appear dyspneic or in distress.  Gave recommendations for outpatient treatment. Patient prescribed Zyrtec, take as, cough syrup for bedtime use and recommended allergy and zinc.  Diane Ramsey was evaluated in Emergency Department on 07/28/2019 for the symptoms described in the history of present illness. She was evaluated in the context of the global COVID-19 pandemic, which necessitated consideration that the patient might be at risk for infection with the SARS-CoV-2 virus that causes COVID-19. Institutional protocols and algorithms that pertain to the evaluation of patients at risk for COVID-19 are in a state of rapid change based on information released by regulatory bodies including the CDC and federal and state organizations. These policies and algorithms were followed during the patient's care in the ED.     This patient appears reasonably screened and I doubt any other medical condition requiring further workup, evaluation, or treatment in the ED at this time prior to discharge.   Patient's vitals are WNL apart from vital sign abnormalities discussed above, patient is in NAD, and able to ambulate in the ED at their baseline. Pain has been managed or a plan has been made for home management and has no complaints prior to discharge. Patient is comfortable with above plan and is stable for discharge at this  time. All questions were answered prior to disposition. Results from the ER workup discussed with the patient face to face and all questions answered to the best of my ability. The patient is safe for discharge with strict return precautions. Patient appears safe for discharge with appropriate follow-up. Conveyed my impression with the patient and they voiced understanding and are agreeable to plan.   An After Visit Summary was printed and given to the patient.  Portions of this note were generated with [employer]. Dictation errors may occur despite best attempts at proofreading.    Final Clinical Impressions(s) / ED Diagnoses   Final diagnoses:  Suspected COVID-19 virus infection  Viral illness    ED Discharge Orders         Ordered    fluticasone (FLONASE) 50 MCG/ACT nasal spray  Daily     07/28/19 2006    cetirizine (ZYRTEC ALLERGY) 10 MG tablet  Daily     07/28/19 2006    guaiFENesin-dextromethorphan (ROBITUSSIN DM) 100-10 MG/5ML syrup  Every 4 hours PRN     07/28/19 2006           Diane Ramsey, [place] 07/28/19 2011    Margette Fast, MD 07/29/19 1127

## 2019-07-28 NOTE — ED Triage Notes (Addendum)
Pt arrived  POV ambulatory into ED CC COVID exposure from work at [employer]. Pt reports productive cough SOB on excertion, weakness, lightheadedness X 3 days. Pt was tested at work on Tuesday 17th pending results. New onset light headedness today   Hx HTN  VSS

## 2019-07-28 NOTE — Discharge Instructions (Addendum)
Please use Tylenol for symptoms as discussed.  I have prescribed Zyrtec.  You may also use Benadryl if you prefer or buy this over-the-counter.  I also prescribed fluticasone.  And a cough syrup for use at night.   In the meantime follow CDC guidelines and quarantine, wear a mask, wash hands often.  Please take over the counter vitamin D 2000-4000 units per day and zinc 50 mg per day for the next two weeks.   Please return to ED if you feel have difficulty breathing or have emergent, new or concerning symptoms.  Patients who have symptoms consistent with COVID-19 should self isolated for: At least 3 days (72 hours) have passed since recovery, defined as resolution of fever without the use of fever reducing medications and improvement in respiratory symptoms (e.g., cough, shortness of breath), and At least 7 days have passed since symptoms first appeared.       Person Under Monitoring Name: Diane Ramsey  Location: [place] [address]   Infection Prevention Recommendations for Individuals Confirmed to have, or Being Evaluated for, 2019 Novel Coronavirus (COVID-19) Infection Who Receive Care at Home  Individuals who are confirmed to have, or are being evaluated for, COVID-19 should follow the prevention steps below until a healthcare provider or local or state health department says they can return to normal activities.  Stay home except to get medical care You should restrict activities outside your home, except for getting medical care. Do not go to work, school, or public areas, and do not use public transportation or taxis.  Call ahead before visiting your doctor Before your medical appointment, call the healthcare provider and tell them that you have, or are being evaluated for, COVID-19 infection. This will help the healthcare providers office take steps to keep other people from getting infected. Ask your healthcare provider to call the local or state health  department.  Monitor your symptoms Seek prompt medical attention if your illness is worsening (e.g., difficulty breathing). Before going to your medical appointment, call the healthcare provider and tell them that you have, or are being evaluated for, COVID-19 infection. Ask your healthcare provider to call the local or state health department.  Wear a facemask You should wear a facemask that covers your nose and mouth when you are in the same room with other people and when you visit a healthcare provider. People who live with or visit you should also wear a facemask while they are in the same room with you.  Separate yourself from other people in your home As much as possible, you should stay in a different room from other people in your home. Also, you should use a separate bathroom, if available.  Avoid sharing household items You should not share dishes, drinking glasses, cups, eating utensils, towels, bedding, or other items with other people in your home. After using these items, you should wash them thoroughly with soap and water.  Cover your coughs and sneezes Cover your mouth and nose with a tissue when you cough or sneeze, or you can cough or sneeze into your sleeve. Throw used tissues in a lined trash can, and immediately wash your hands with soap and water for at least 20 seconds or use an alcohol-based hand rub.  Wash your [facility] your hands often and thoroughly with soap and water for at least 20 seconds. You can use an alcohol-based hand sanitizer if soap and water are not available and if your hands are not visibly  dirty. Avoid touching your eyes, nose, and mouth with unwashed hands.   Prevention Steps for Caregivers and Household Members of Individuals Confirmed to have, or Being Evaluated for, COVID-19 Infection Being Cared for in the Home  If you live with, or provide care at home for, a person confirmed to have, or being evaluated for, COVID-19  infection please follow these guidelines to prevent infection:  Follow healthcare providers instructions Make sure that you understand and can help the patient follow any healthcare provider instructions for all care.  Provide for the patients basic needs You should help the patient with basic needs in the home and provide support for getting groceries, prescriptions, and other personal needs.  Monitor the patients symptoms If they are getting sicker, call his or her medical provider and tell them that the patient has, or is being evaluated for, COVID-19 infection. This will help the healthcare providers office take steps to keep other people from getting infected. Ask the healthcare provider to call the local or state health department.  Limit the number of people who have contact with the patient If possible, have only one caregiver for the patient. Other household members should stay in another home or place of residence. If this is not possible, they should stay in another room, or be separated from the patient as much as possible. Use a separate bathroom, if available. Restrict visitors who do not have an essential need to be in the home.  Keep older adults, very young children, and other sick people away from the patient Keep older adults, very young children, and those who have compromised immune systems or chronic health conditions away from the patient. This includes people with chronic heart, lung, or kidney conditions, diabetes, and cancer.  Ensure good ventilation Make sure that shared spaces in the home have good air flow, such as from an air conditioner or an opened window, weather permitting.  Wash your hands often Wash your hands often and thoroughly with soap and water for at least 20 seconds. You can use an alcohol based hand sanitizer if soap and water are not available and if your hands are not visibly dirty. Avoid touching your eyes, nose, and mouth with  unwashed hands. Use disposable paper towels to dry your hands. If not available, use dedicated cloth towels and replace them when they become wet.  Wear a facemask and gloves Wear a disposable facemask at all times in the room and gloves when you touch or have contact with the patients blood, body fluids, and/or secretions or excretions, such as sweat, saliva, sputum, nasal mucus, vomit, urine, or feces.  Ensure the mask fits over your nose and mouth tightly, and do not touch it during use. Throw out disposable facemasks and gloves after using them. Do not reuse. Wash your hands immediately after removing your facemask and gloves. If your personal clothing becomes contaminated, carefully remove clothing and launder. Wash your hands after handling contaminated clothing. Place all used disposable facemasks, gloves, and other waste in a lined container before disposing them with other household waste. Remove gloves and wash your hands immediately after handling these items.  Do not share dishes, glasses, or other household items with the patient Avoid sharing household items. You should not share dishes, drinking glasses, cups, eating utensils, towels, bedding, or other items with a patient who is confirmed to have, or being evaluated for, COVID-19 infection. After the person uses these items, you should wash them thoroughly with soap and water.  Wash laundry thoroughly Immediately remove and wash clothes or bedding that have blood, body fluids, and/or secretions or excretions, such as sweat, saliva, sputum, nasal mucus, vomit, urine, or feces, on them. Wear gloves when handling laundry from the patient. Read and follow directions on labels of laundry or clothing items and detergent. In general, wash and dry with the warmest temperatures recommended on the label.  Clean all areas the individual has used often Clean all touchable surfaces, such as counters, tabletops, doorknobs, bathroom fixtures,  toilets, phones, keyboards, tablets, and bedside tables, every day. Also, clean any surfaces that may have blood, body fluids, and/or secretions or excretions on them. Wear gloves when cleaning surfaces the patient has come in contact with. Use a diluted bleach solution (e.g., dilute bleach with 1 part bleach and 10 parts water) or a household disinfectant with a label that says EPA-registered for coronaviruses. To make a bleach solution at home, add 1 tablespoon of bleach to 1 quart (4 cups) of water. For a larger supply, add  cup of bleach to 1 gallon (16 cups) of water. Read labels of cleaning products and follow recommendations provided on product labels. Labels contain instructions for safe and effective use of the cleaning product including precautions you should take when applying the product, such as wearing gloves or eye protection and making sure you have good ventilation during use of the product. Remove gloves and wash hands immediately after cleaning.  Monitor yourself for signs and symptoms of illness Caregivers and household members are considered close contacts, should monitor their health, and will be asked to limit movement outside of the home to the extent possible. Follow the monitoring steps for close contacts listed on the symptom monitoring form.   ? If you have additional questions, contact your local health department or call the epidemiologist on call at [phone] (available 24/7). ? This guidance is subject to change. For the most up-to-date guidance from [facility], please refer to their website: [email or web address]

## 2019-08-11 ENCOUNTER — Ambulatory Visit: Payer: 59 | Admitting: Podiatry

## 2019-11-02 IMAGING — US US TRANSVAGINAL NON-OB
1 series · 13 of 25 positions shown · non-contrast
Comparison: None.

CLINICAL DATA: Pelvic pain

EXAM:
TRANSABDOMINAL AND TRANSVAGINAL ULTRASOUND OF PELVIS
DOPPLER ULTRASOUND OF OVARIES
TECHNIQUE: Both transabdominal and transvaginal ultrasound examinations of the
pelvis were performed. Transabdominal technique was performed for
global imaging of the pelvis including uterus, ovaries, adnexal
regions, and pelvic cul-de-sac.
It was necessary to proceed with endovaginal exam following the
transabdominal exam to visualize the uterus and ovaries. Color and
duplex Doppler ultrasound was utilized to evaluate blood flow to the
ovaries.

[Series 1: us transvaginal non-ob · 85 acquisitions, 13 frames shown]
[im 1/85]
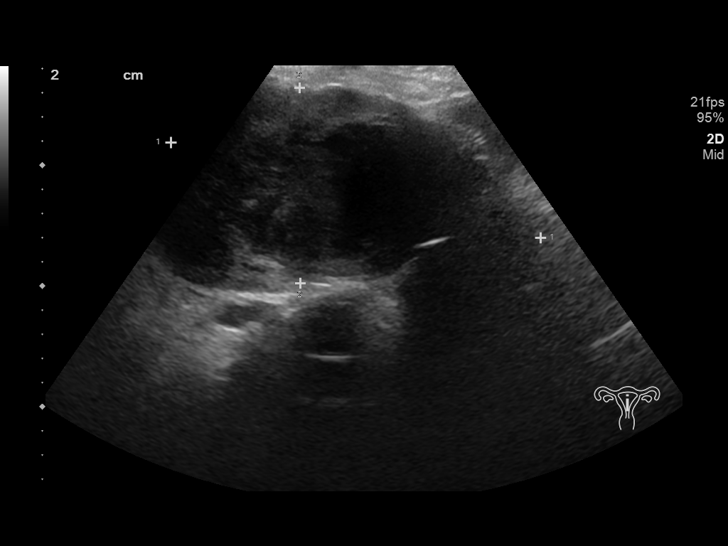
[im 8/85]
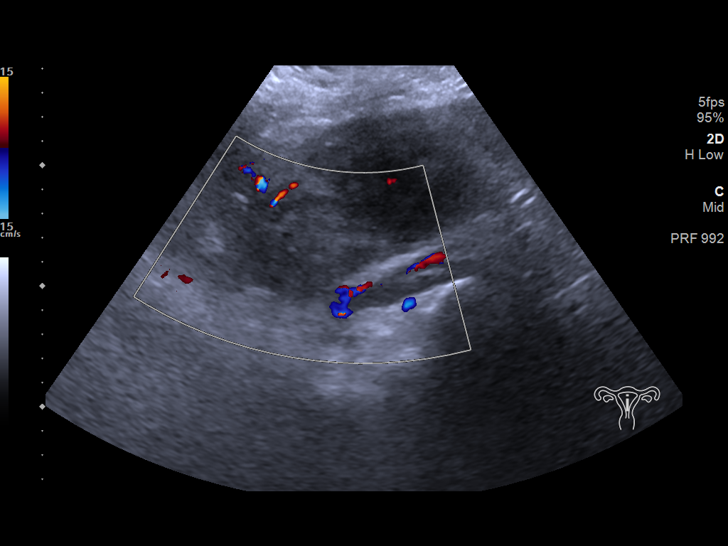
[im 15/85]
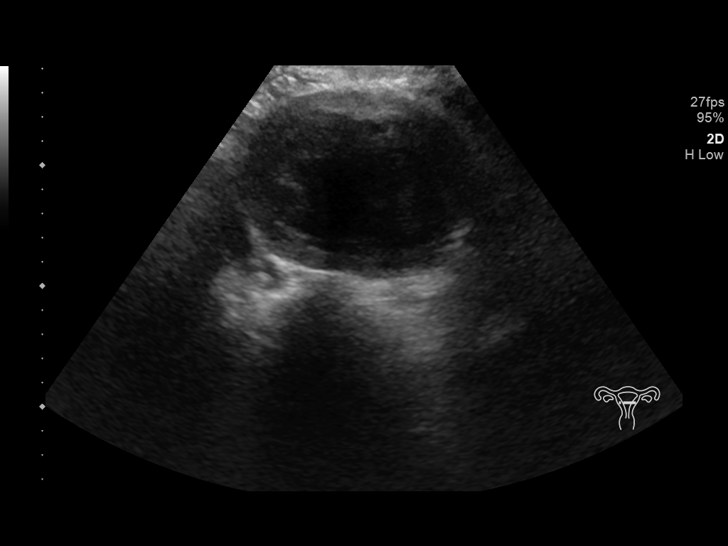
[im 22/85]
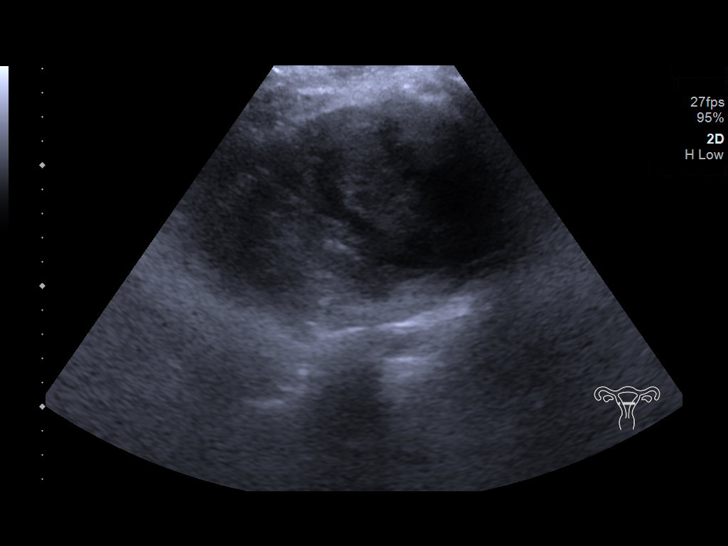
[im 29/85]
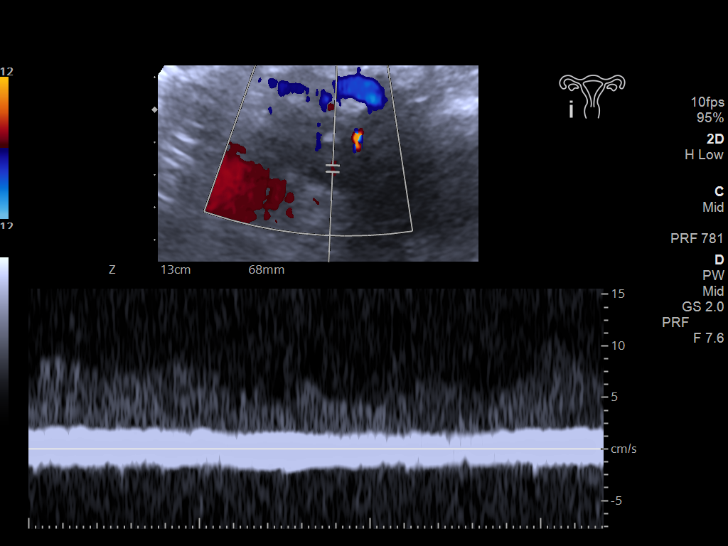
[im 36/85]
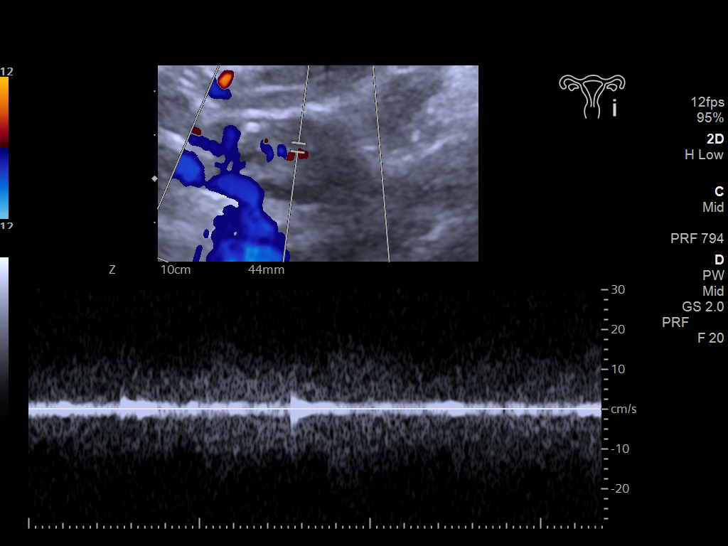
[im 43/85]
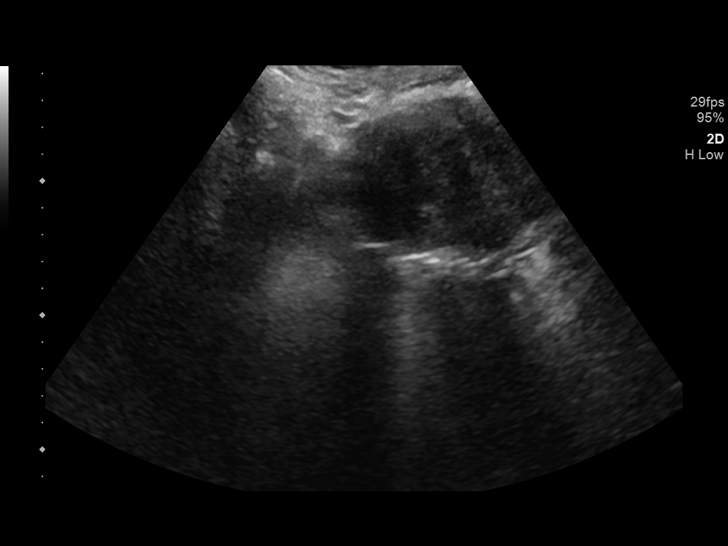
[im 50/85]
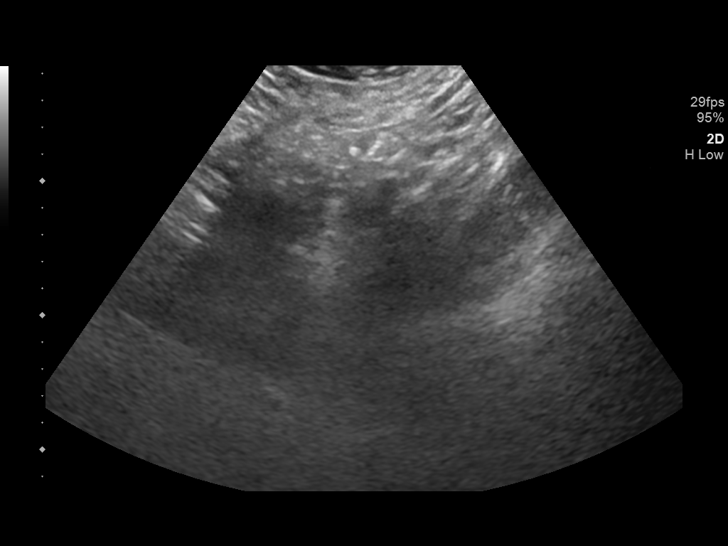
[im 57/85]
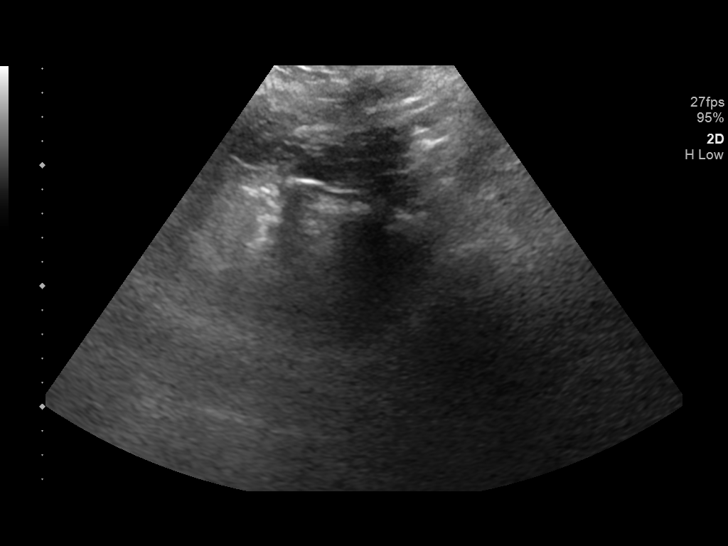
[im 64/85]
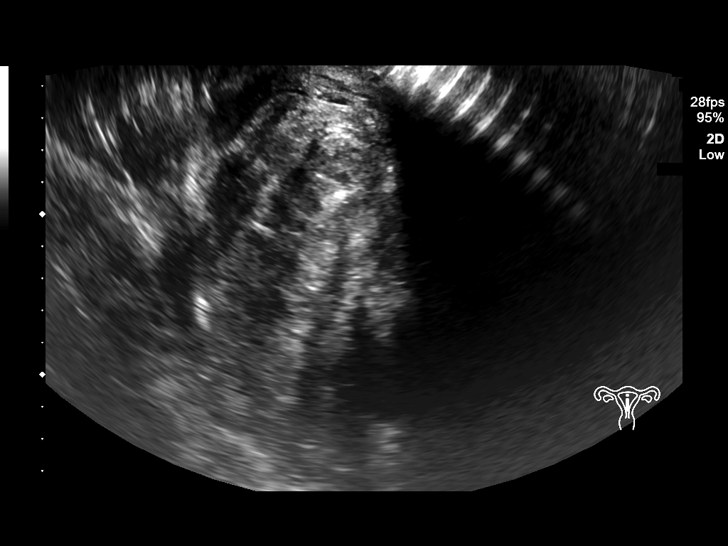
[im 71/85]
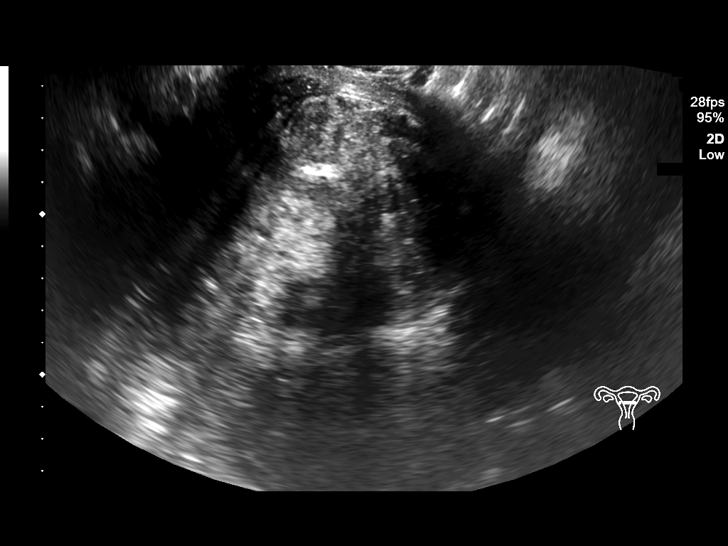
[im 78/85]
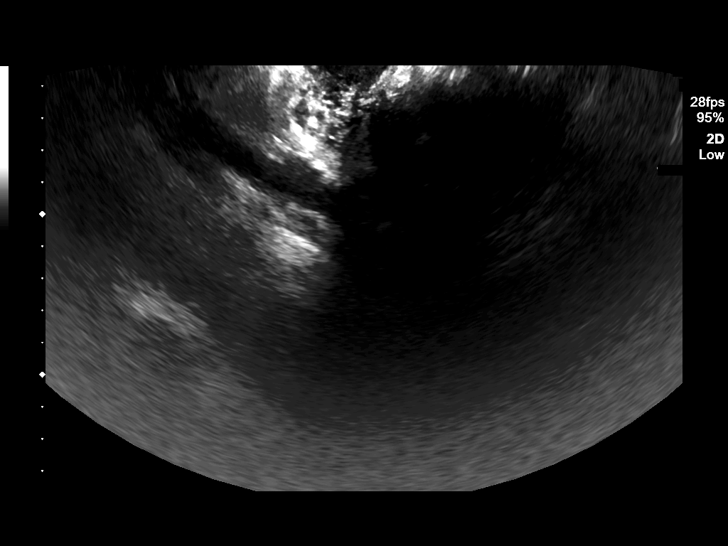
[im 85/85]
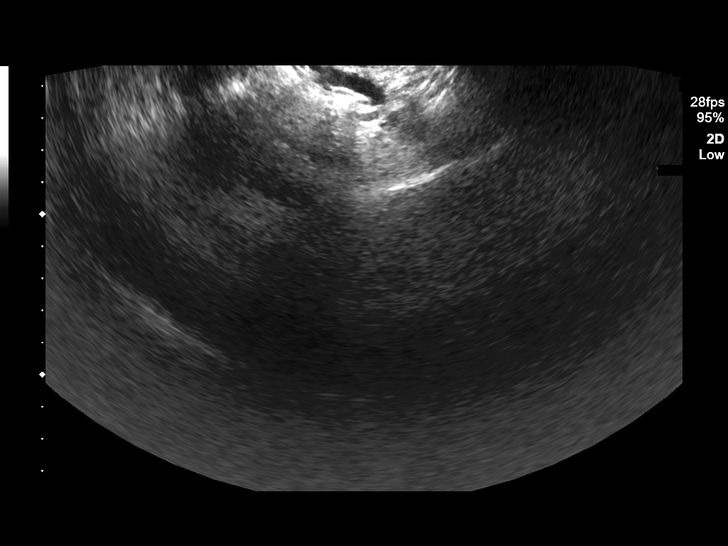

[13 of 25 positions shown; findings below may reference images not displayed]

FINDINGS: Uterus

Measurements: 15.8 x 8.1 x 11.4 cm = volume: 763 mL. Right anterior
uterine body fibroid measures 5.8 x 5.4 x 4.6 cm. Posterior uterine
fundal fibroid measures 4.3 x 4.0 x 4.7 cm. Left uterine body
fibroid measures 6.8 x 6.5 x 5.6 cm.

Endometrium

Thickness: 6 mm.  No focal abnormality visualized.

Right ovary

Measurements: 3.9 x 2.0 x 3.3 cm = volume: 13.5 mL. Normal
appearance.

Left ovary

Measurements: 4.1 x 1.8 x 2.4 cm = volume: 9.3 mL. Normal
appearance.

Pulsed Doppler evaluation of both ovaries demonstrates normal
low-resistance arterial and venous waveforms.

Other findings

No abnormal free fluid.
IMPRESSION: 1. Enlarged fibroid uterus.
2. Normal ovaries with normal blood flow.

## 2020-06-02 IMAGING — CT CT ANGIOGRAPHY CHEST
2 of 6 series · 18 of 46 positions shown · IV contrast (OMNIPAQUE)
Comparison: 11/17/2014

CLINICAL DATA: Midsternal chest pain with shortness of breath,
history of lupus

EXAM:
CT ANGIOGRAPHY CHEST WITH CONTRAST
TECHNIQUE: Multidetector CT imaging of the chest was performed using the
standard protocol during bolus administration of intravenous
contrast. Multiplanar CT image reconstructions and MIPs were
obtained to evaluate the vascular anatomy.
CONTRAST:  100mL OMNIPAQUE IOHEXOL 350 MG/ML SOLN

[Series 5: thins · axial · 0.66mm/px · z∈[+11,+221]mm · 15 of 231 slices shown]
[im 11/231  lung]
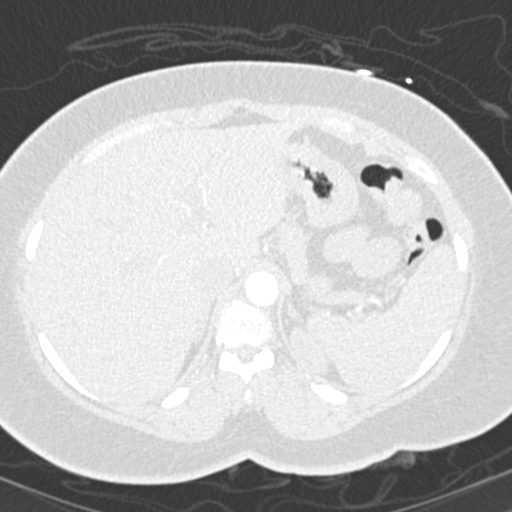
[im 31/231  soft-tissue]
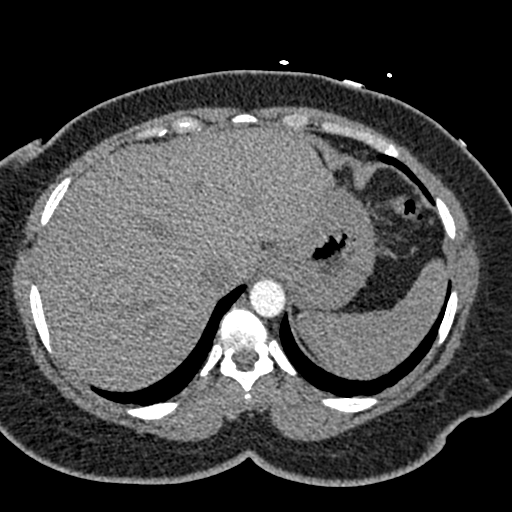
[im 41/231  lung]
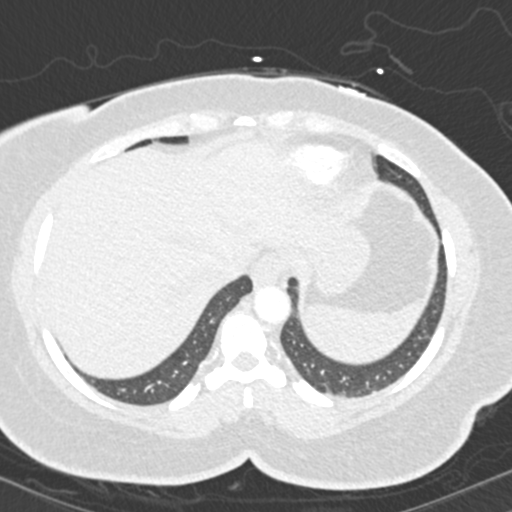
[im 61/231  soft-tissue]
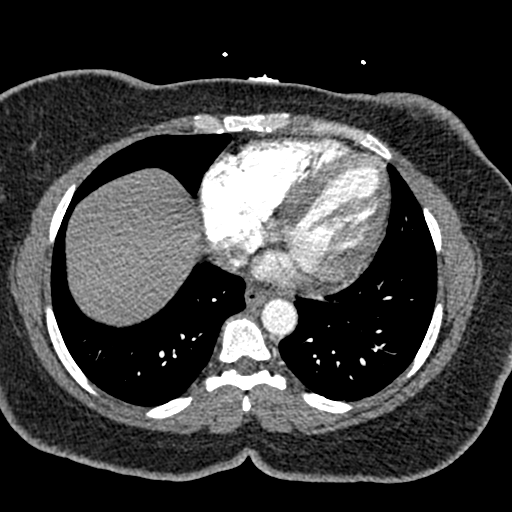
[im 71/231  lung]
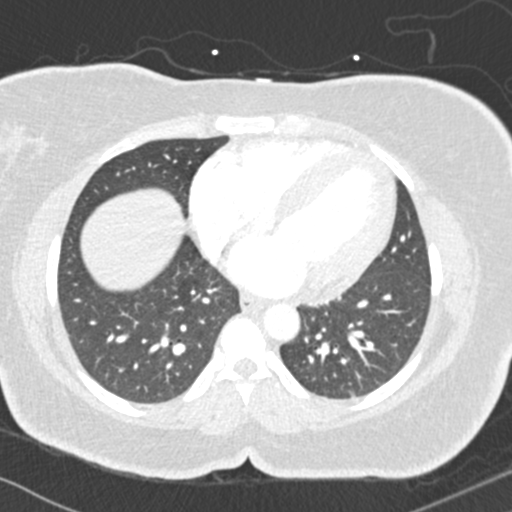
[im 91/231  soft-tissue]
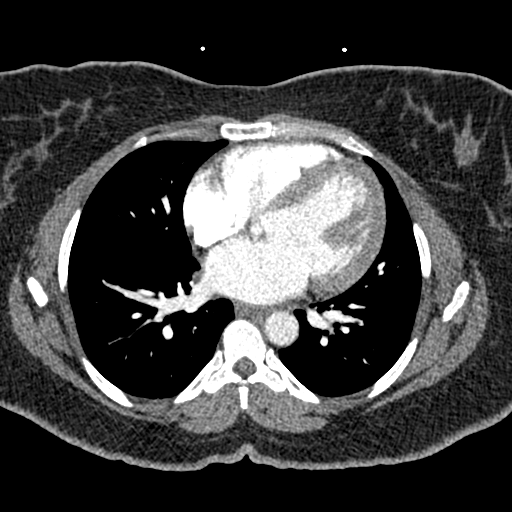
[im 101/231  lung]
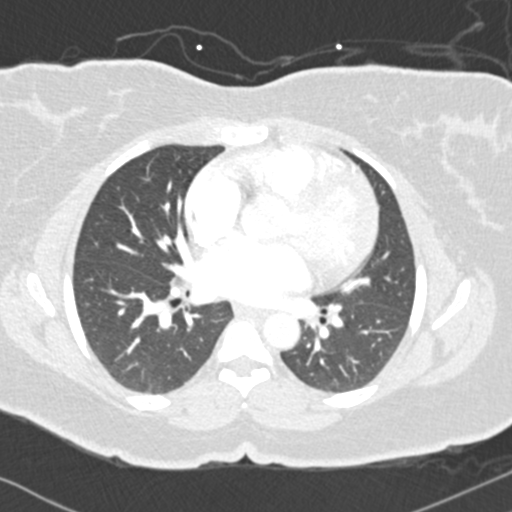
[im 121/231  soft-tissue]
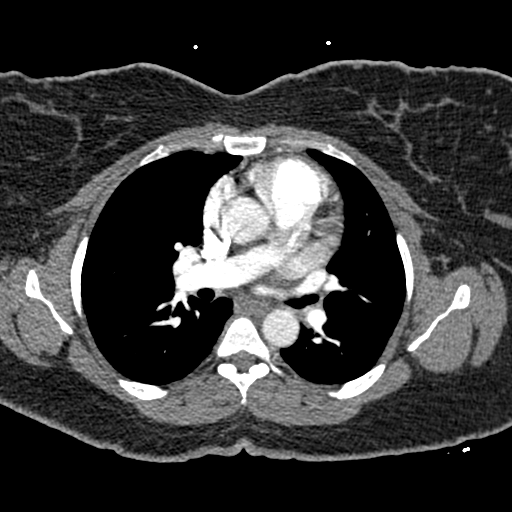
[im 131/231  lung]
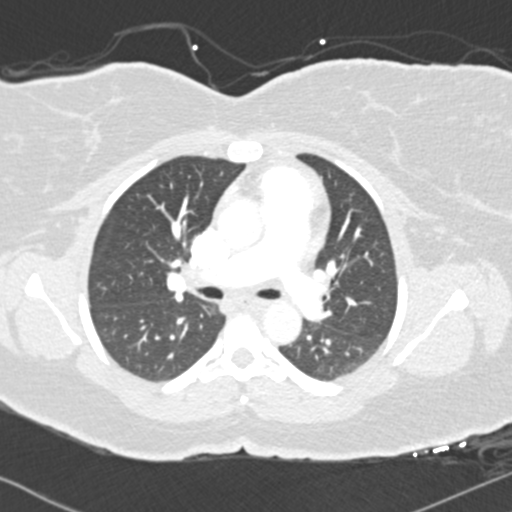
[im 141/231  soft-tissue]
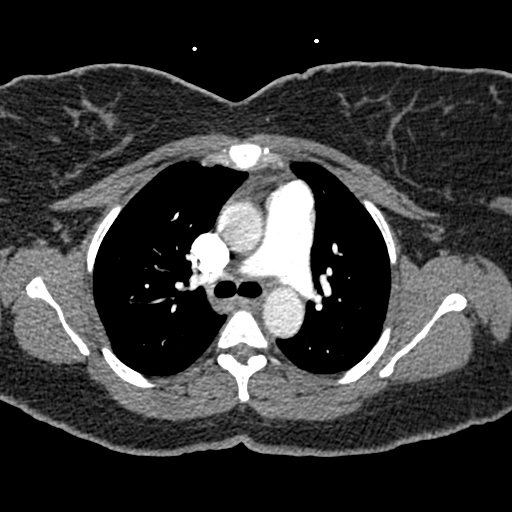
[im 161/231  lung]
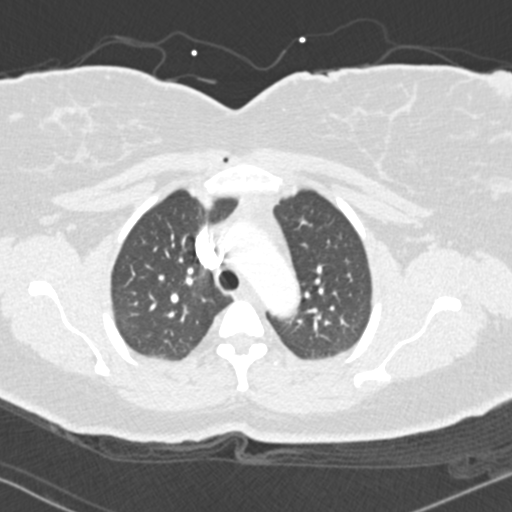
[im 171/231  soft-tissue]
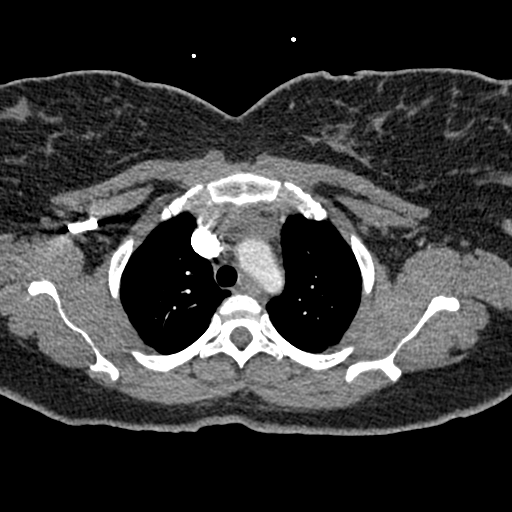
[im 191/231  lung]
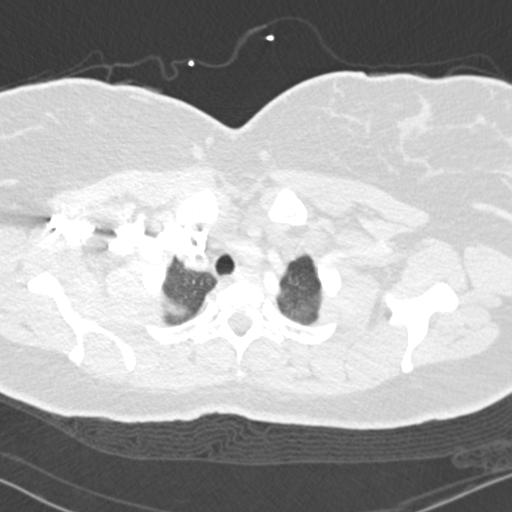
[im 201/231  soft-tissue]
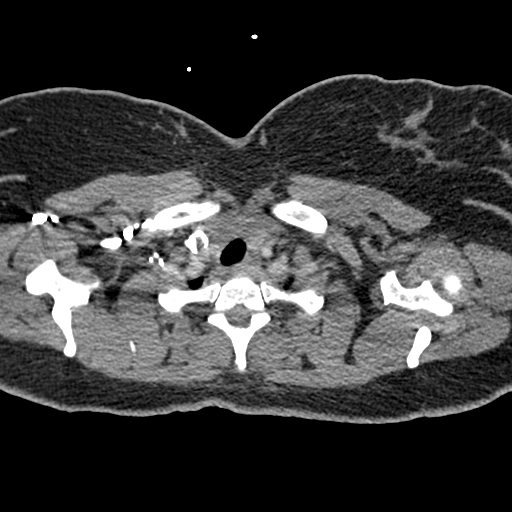
[im 221/231  lung]
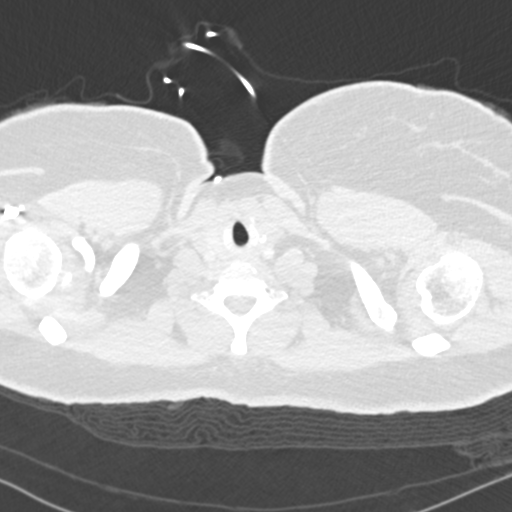

[Series 6: coronal mpr · coronal · 0.46mm/px · 3 of 126 slices shown]
[im 32/126  soft-tissue]
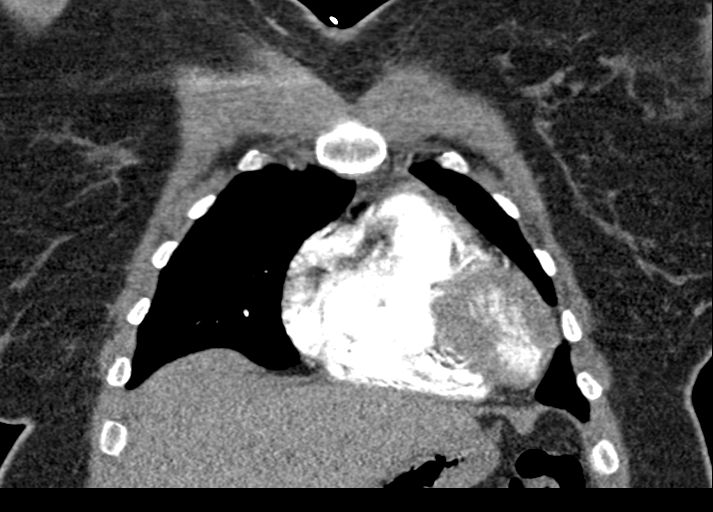
[im 63/126  soft-tissue]
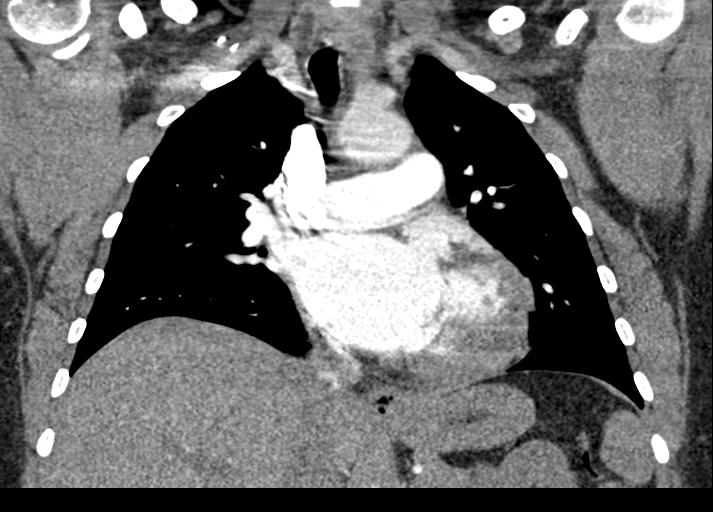
[im 94/126  soft-tissue]
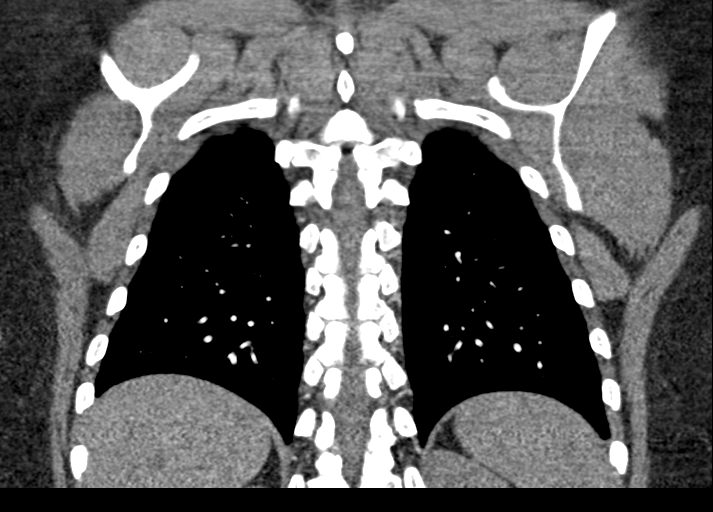

[18 of 46 positions shown; findings below may reference images not displayed]

FINDINGS: Cardiovascular: Satisfactory opacification of the pulmonary arteries
to the segmental level. No evidence of pulmonary embolism. Normal
heart size. No pericardial effusion.

Mediastinum/Nodes: No enlarged mediastinal, hilar, or axillary lymph
nodes. Thyroid gland, trachea, and esophagus demonstrate no
significant findings.

Lungs/Pleura: 3 mm pulmonary nodule of the left upper lobe (series
10, image 36). No pleural effusion or pneumothorax.

Upper Abdomen: No acute abnormality.

Musculoskeletal: No chest wall abnormality. No acute or significant
osseous findings.

Review of the MIP images confirms the above findings.
IMPRESSION: 1.  Negative examination for pulmonary embolism.

2. There is a nonspecific 3 mm pulmonary nodule of the left upper
lobe (series 10, image 36). CT follow-up of this nodule is optional
at 12 months if high risk factors for lung cancer are present.

## 2020-06-02 IMAGING — DX PORTABLE CHEST - 1 VIEW
1 series · 1 of 1 positions shown · non-contrast
Comparison: 11/17/2014

CLINICAL DATA: Dizziness, shortness of breath

EXAM:
PORTABLE CHEST 1 VIEW

[chest ap]
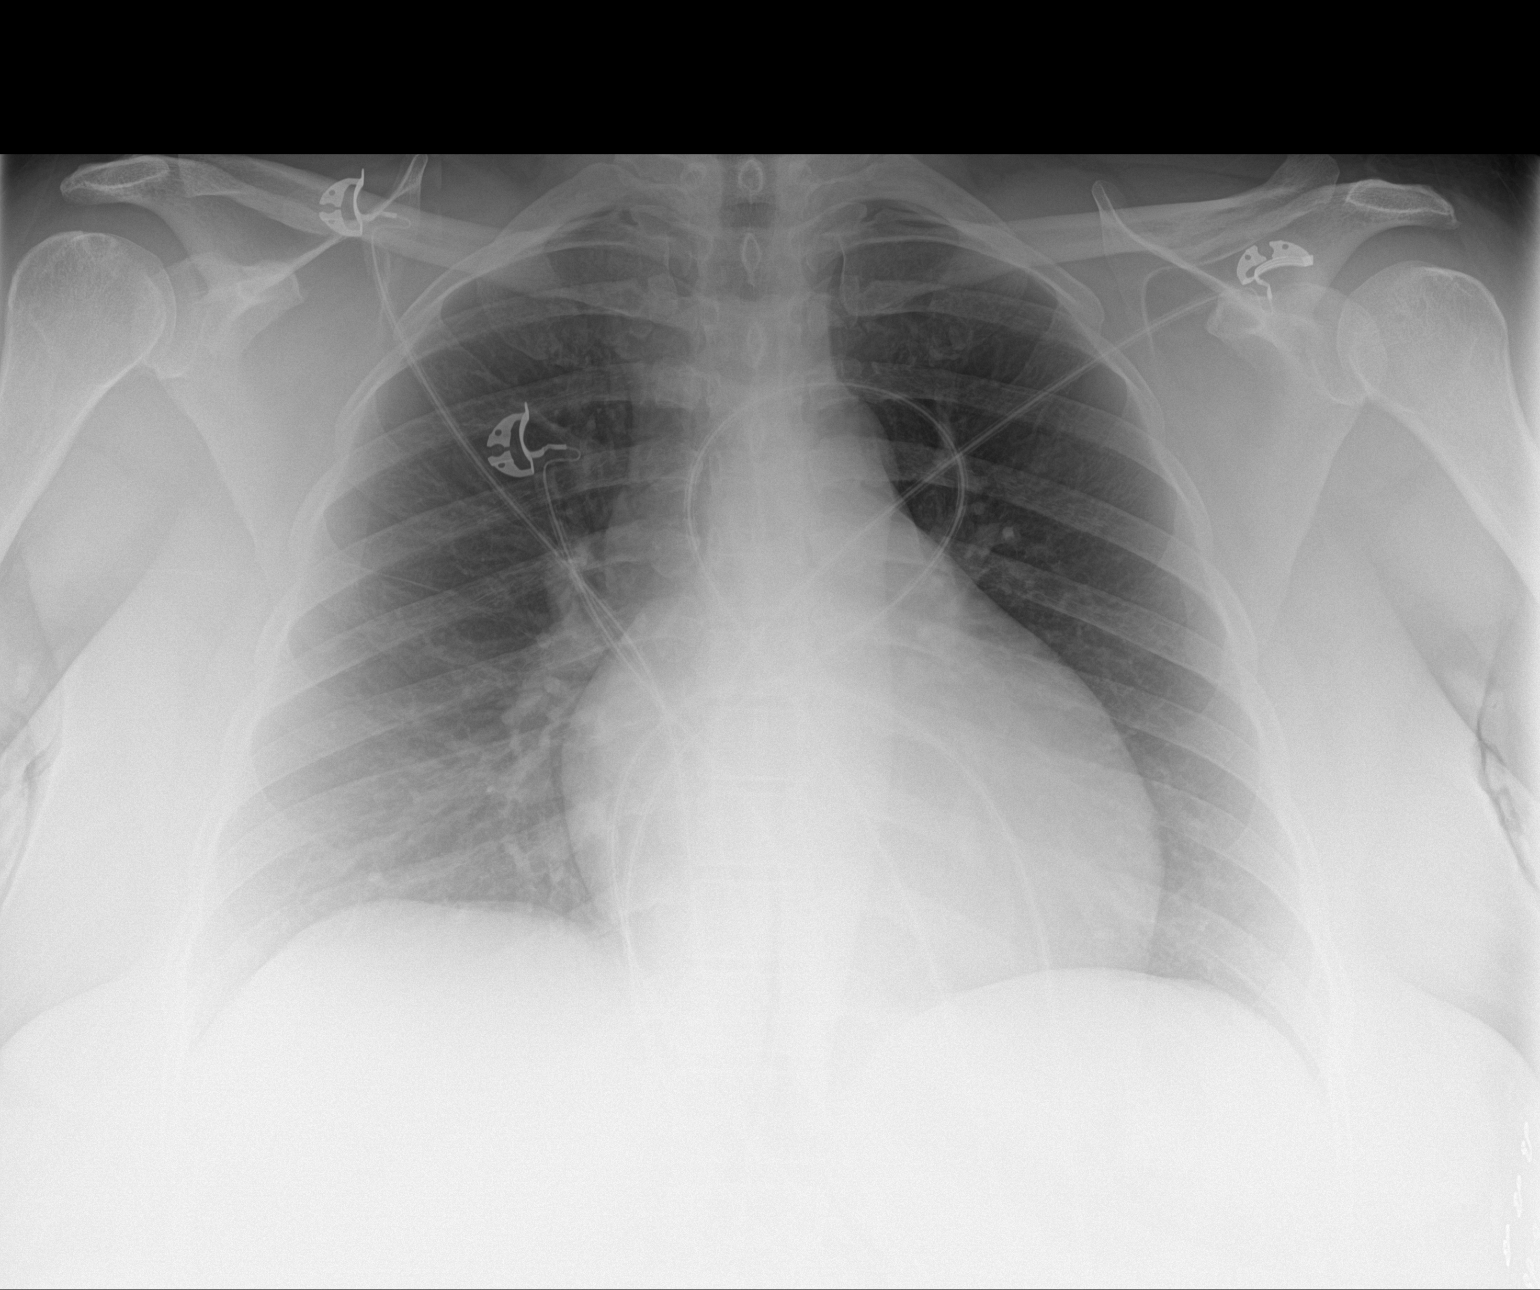

[1 of 1 positions shown; findings below may reference images not displayed]

FINDINGS: Mild cardiomegaly. Both lungs are clear. The visualized skeletal
structures are unremarkable.
IMPRESSION: Mild cardiomegaly without acute abnormality of the lungs in AP
portable projection.

## 2021-07-30 ENCOUNTER — Emergency Department (HOSPITAL_COMMUNITY)
Admission: EM | Admit: 2021-07-30 | Discharge: 2021-07-31 | Disposition: A | Discharge status: Home / Self Care | Payer: PRIVATE HEALTH INSURANCE | Attending: Student | Admitting: Student

## 2021-07-30 ENCOUNTER — Encounter (HOSPITAL_COMMUNITY): Payer: Self-pay

## 2021-07-30 ENCOUNTER — Other Ambulatory Visit: Payer: Self-pay

## 2021-07-30 ENCOUNTER — Emergency Department (HOSPITAL_COMMUNITY): Payer: PRIVATE HEALTH INSURANCE

## 2021-07-30 DIAGNOSIS — R0602 Shortness of breath: Secondary | ICD-10-CM | POA: Insufficient documentation

## 2021-07-30 DIAGNOSIS — R1033 Periumbilical pain: Secondary | ICD-10-CM | POA: Insufficient documentation

## 2021-07-30 DIAGNOSIS — I10 Essential (primary) hypertension: Secondary | ICD-10-CM | POA: Insufficient documentation

## 2021-07-30 DIAGNOSIS — Z20822 Contact with and (suspected) exposure to covid-19: Secondary | ICD-10-CM | POA: Insufficient documentation

## 2021-07-30 DIAGNOSIS — M25519 Pain in unspecified shoulder: Secondary | ICD-10-CM | POA: Insufficient documentation

## 2021-07-30 DIAGNOSIS — D259 Leiomyoma of uterus, unspecified: Secondary | ICD-10-CM

## 2021-07-30 DIAGNOSIS — N3000 Acute cystitis without hematuria: Secondary | ICD-10-CM

## 2021-07-30 DIAGNOSIS — R0781 Pleurodynia: Secondary | ICD-10-CM | POA: Insufficient documentation

## 2021-07-30 LAB — URINALYSIS, ROUTINE W REFLEX MICROSCOPIC
Bilirubin Urine: NEGATIVE
Glucose, UA: NEGATIVE mg/dL
Hgb urine dipstick: NEGATIVE
Ketones, ur: NEGATIVE mg/dL
Nitrite: NEGATIVE
Protein, ur: NEGATIVE mg/dL
Specific Gravity, Urine: 1.018 (ref 1.005–1.030)
pH: 5 (ref 5.0–8.0)

## 2021-07-30 LAB — CBC WITH DIFFERENTIAL/PLATELET
Abs Immature Granulocytes: 0.01 10*3/uL (ref 0.00–0.07)
Basophils Absolute: 0 10*3/uL (ref 0.0–0.1)
Basophils Relative: 0 %
Eosinophils Absolute: 0 10*3/uL (ref 0.0–0.5)
Eosinophils Relative: 1 %
HCT: 37.9 % (ref 36.0–46.0)
Hemoglobin: 11.9 g/dL — ABNORMAL LOW (ref 12.0–15.0)
Immature Granulocytes: 0 %
Lymphocytes Relative: 7 %
Lymphs Abs: 0.6 10*3/uL — ABNORMAL LOW (ref 0.7–4.0)
MCH: 26.4 pg (ref 26.0–34.0)
MCHC: 31.4 g/dL (ref 30.0–36.0)
MCV: 84 fL (ref 80.0–100.0)
Monocytes Absolute: 0.4 10*3/uL (ref 0.1–1.0)
Monocytes Relative: 5 %
Neutro Abs: 6.8 10*3/uL (ref 1.7–7.7)
Neutrophils Relative %: 87 %
Platelets: 235 10*3/uL (ref 150–400)
RBC: 4.51 MIL/uL (ref 3.87–5.11)
RDW: 16.5 % — ABNORMAL HIGH (ref 11.5–15.5)
WBC: 7.8 10*3/uL (ref 4.0–10.5)
nRBC: 0 % (ref 0.0–0.2)

## 2021-07-30 LAB — COMPREHENSIVE METABOLIC PANEL
ALT: 13 U/L (ref 0–44)
AST: 18 U/L (ref 15–41)
Albumin: 3.7 g/dL (ref 3.5–5.0)
Alkaline Phosphatase: 53 U/L (ref 38–126)
Anion gap: 6 (ref 5–15)
BUN: 15 mg/dL (ref 6–20)
CO2: 24 mmol/L (ref 22–32)
Calcium: 8.7 mg/dL — ABNORMAL LOW (ref 8.9–10.3)
Chloride: 106 mmol/L (ref 98–111)
Creatinine, Ser: 0.87 mg/dL (ref 0.44–1.00)
GFR, Estimated: >60 mL/min (ref >60)
Glucose, Bld: 116 mg/dL — ABNORMAL HIGH (ref 70–99)
Potassium: 3.9 mmol/L (ref 3.5–5.1)
Sodium: 136 mmol/L (ref 135–145)
Total Bilirubin: 0.6 mg/dL (ref 0.3–1.2)
Total Protein: 7.5 g/dL (ref 6.5–8.1)

## 2021-07-30 LAB — LIPASE, BLOOD: Lipase: 27 U/L (ref 11–51)

## 2021-07-30 MED ORDER — TRIAMTERENE-HCTZ 37.5-25 MG PO TABS
1.0000 | ORAL_TABLET | ORAL | Status: AC
  Administered 2021-07-30: 1 via ORAL
  Filled 2021-07-30: qty 1

## 2021-07-30 MED ORDER — IOHEXOL 350 MG/ML SOLN
100.0000 mL | Freq: Once | INTRAVENOUS | Status: AC | PRN
  Administered 2021-07-30: 100 mL via INTRAVENOUS

## 2021-07-30 MED ORDER — CEPHALEXIN 500 MG PO CAPS
500.0000 mg | ORAL_CAPSULE | Freq: Once | ORAL | Status: AC
  Administered 2021-07-31: 500 mg via ORAL
  Filled 2021-07-30: qty 1

## 2021-07-30 NOTE — ED Triage Notes (Signed)
Pt reports with left shoulder pain, abdominal pain, and shortness of breath that has been occurring over the past 2 days. Pt states that she has a hernia but doesn't know if it's that or if she is constipated. Pt states that she is out of her BP medication. Upon triage, BP elevated.

## 2021-07-30 NOTE — ED Provider Notes (Signed)
[facility] DEPT Provider Note   CSN: [id number] Arrival date & time: 07/30/21  2219     History Chief Complaint  Patient presents with   Shoulder Pain   Abdominal Pain   Shortness of Breath   Hypertension    Diane Ramsey is a 45 y.o. female with PMH lupus not on controlling therapy, HTN who presents the emergency department for multiple complaints including pleuritic chest and shoulder pain and abdominal pain.  She states that the chest pain/shoulder pain with associated shortness of breath has been occurring over the last 2 days and has been persistent.  She states that this morning she woke up and use the bathroom prompting significant periumbilical abdominal pain that improved with ibuprofen.  She states that she has a history of a abdominal hernia and is unsure if this is related to her current abdominal pain.  She also states that she has been out of her blood pressure medication.  She currently denies nausea, vomiting, fever or other systemic symptoms.   Shoulder Pain Associated symptoms: no back pain and no fever   Abdominal Pain Associated symptoms: chest pain and shortness of breath   Associated symptoms: no chills, no cough, no dysuria, no fever, no hematuria, no sore throat and no vomiting   Shortness of Breath Associated symptoms: abdominal pain and chest pain   Associated symptoms: no cough, no ear pain, no fever, no rash, no sore throat and no vomiting   Hypertension Associated symptoms include chest pain, abdominal pain and shortness of breath.      Past Medical History:  Diagnosis Date   Lupus [facility])     Patient Active Problem List   Diagnosis Date Noted   Other malaise and fatigue 04/07/2014   Weight gain 04/07/2014   Prediabetes 04/07/2014   Essential hypertension, benign 04/07/2014   Joint pain 04/07/2014   Family history of systemic lupus erythematosus 04/07/2014   Unspecified constipation 04/07/2014   ANA positive  04/07/2014    Past Surgical History:  Procedure Laterality Date   CESAREAN SECTION       OB History   No obstetric history on file.     Family History  Problem Relation Age of Onset   Lupus Son    Diabetes Father    Stroke Sister     Social History   Tobacco Use   Smoking status: Never   Smokeless tobacco: Never  Substance Use Topics   Alcohol use: No   Drug use: No    Home Medications Prior to Admission medications   Medication Sig Start Date End Date Taking? Authorizing Provider  cetirizine (ZYRTEC ALLERGY) 10 MG tablet Take 1 tablet (10 mg total) by mouth daily for 14 days. 07/28/19 08/11/19  Tedd Sias, PA  diclofenac (VOLTAREN) 75 MG EC tablet Take 1 tablet (75 mg total) by mouth 2 (two) times daily. 07/21/19   Wallene Huh, DPM  ferrous sulfate 325 (65 FE) MG tablet Take 1 tablet (325 mg total) by mouth 2 (two) times daily with a meal. 10/30/14   Duffy Bruce, MD  fluticasone (FLONASE) 50 MCG/ACT nasal spray Place 1 spray into both nostrils daily. 07/28/19   Fondaw, Kathleene Hazel, PA  guaiFENesin-dextromethorphan (ROBITUSSIN DM) 100-10 MG/5ML syrup Take 5 mLs by mouth every 4 (four) hours as needed for cough. 07/28/19   Tedd Sias, PA  ibuprofen (ADVIL,MOTRIN) 200 MG tablet Take 200 mg by mouth every 6 (six) hours as needed for mild pain. For pain  [provider]  naproxen (NAPROSYN) 500 MG tablet Take 1 tablet (500 mg total) by mouth 2 (two) times daily with a meal. 07/14/19   Caccavale, Sophia, PA-C  triamterene-hydrochlorothiazide (MAXZIDE-25) 37.5-25 MG tablet Take 1 tablet by mouth every morning. 07/08/18   [provider]    Allergies    Patient has no known allergies.  Review of Systems   Review of Systems  Constitutional:  Negative for chills and fever.  HENT:  Negative for ear pain and sore throat.   Eyes:  Negative for pain and visual disturbance.  Respiratory:  Positive for shortness of breath. Negative for cough.    Cardiovascular:  Positive for chest pain. Negative for palpitations.  Gastrointestinal:  Positive for abdominal pain. Negative for vomiting.  Genitourinary:  Negative for dysuria and hematuria.  Musculoskeletal:  Positive for arthralgias. Negative for back pain.  Skin:  Negative for color change and rash.  Neurological:  Negative for seizures and syncope.  All other systems reviewed and are negative.  Physical Exam Updated Vital Signs BP (!) 143/92   Pulse 88   Temp 99.2 F (37.3 C) (Oral)   Resp 16   Ht 5\' 3"  (1.6 m)   Wt 108.9 kg   SpO2 100%   BMI 42.51 kg/m   Physical Exam Vitals and nursing note reviewed.  Constitutional:      General: She is not in acute distress.    Appearance: She is well-developed.  HENT:     Head: Normocephalic and atraumatic.  Eyes:     Conjunctiva/sclera: Conjunctivae normal.  Cardiovascular:     Rate and Rhythm: Normal rate and regular rhythm.     Heart sounds: No murmur heard. Pulmonary:     Effort: Pulmonary effort is normal. No respiratory distress.     Breath sounds: Normal breath sounds.  Abdominal:     Palpations: Abdomen is soft.     Tenderness: There is abdominal tenderness in the periumbilical area.  Musculoskeletal:        General: No swelling.     Cervical back: Neck supple.  Skin:    General: Skin is warm and dry.     Capillary Refill: Capillary refill takes less than 2 seconds.  Neurological:     Mental Status: She is alert.  Psychiatric:        Mood and Affect: Mood normal.    ED Results / Procedures / Treatments   Labs (all labs ordered are listed, but only abnormal results are displayed) Labs Reviewed  CBC WITH DIFFERENTIAL/PLATELET - Abnormal; Notable for the following components:      Result Value   Hemoglobin 11.9 (*)    RDW 16.5 (*)    Lymphs Abs 0.6 (*)    All other components within normal limits  COMPREHENSIVE METABOLIC PANEL - Abnormal; Notable for the following components:   Glucose, Bld 116 (*)     Calcium 8.7 (*)    All other components within normal limits  URINALYSIS, ROUTINE W REFLEX MICROSCOPIC - Abnormal; Notable for the following components:   Leukocytes,Ua MODERATE (*)    Bacteria, UA RARE (*)    All other components within normal limits  RESP PANEL BY RT-PCR (FLU A&B, COVID) ARPGX2  LIPASE, BLOOD    EKG None  Radiology DG Chest 2 View  Result Date: 07/30/2021 CLINICAL DATA:  Chest pain for 1 month EXAM: CHEST - 2 VIEW COMPARISON:  03/01/2019 FINDINGS: The heart size and mediastinal contours are within normal limits. Both lungs are  clear. The visualized skeletal structures are unremarkable. IMPRESSION: No active cardiopulmonary disease. Electronically Signed   By: Inez Catalina M.D.   On: 07/30/2021 23:09    Procedures Procedures   Medications Ordered in ED Medications  cephALEXin (KEFLEX) capsule 500 mg (has no administration in time range)  iohexol (OMNIPAQUE) 350 MG/ML injection 100 mL (has no administration in time range)  triamterene-hydrochlorothiazide (MAXZIDE-25) 37.5-25 MG per tablet 1 tablet (1 tablet Oral Given 07/30/21 2323)    ED Course  I have reviewed the triage vital signs and the nursing notes.  Pertinent labs & imaging results that were available during my care of the patient were reviewed by me and considered in my medical decision making (see chart for details).    MDM Rules/Calculators/A&P                           Patient seen the emergency department for evaluation of multiple complaints including pleuritic chest pain and abdominal pain.  Physical exam reveals periumbilical abdominal tenderness but cardiopulmonary exam is unremarkable.  [place] evaluation largely unremarkable, urinalysis with moderate leuk esterase, 6-10 white blood cells and rare bacteria.  Given the patient's abdominal pain we will treat with Keflex.  As the patient has lupus with pleuritic chest pain, we will pursue CT PE imaging and in the setting of her abdominal pain  with known hernia we will pursue CT abdominal imaging.  This imaging is currently pending at time of signout.  Patient signed out to oncoming provider.  Please see provider signout for continuation of work-up. Final Clinical Impression(s) / ED Diagnoses Final diagnoses:  None    Rx / DC Orders ED Discharge Orders     None        Sandor Arboleda, Debe Coder, MD 07/30/21 2357

## 2021-07-31 LAB — RESP PANEL BY RT-PCR (FLU A&B, COVID) ARPGX2
Influenza A by PCR: NEGATIVE
Influenza B by PCR: NEGATIVE
SARS Coronavirus 2 by RT PCR: NEGATIVE

## 2021-07-31 MED ORDER — POLYETHYLENE GLYCOL 3350 17 GM/SCOOP PO POWD: 17.0000 g | Freq: Every day | ORAL | 0 refills | Status: AC

## 2021-07-31 MED ORDER — CEPHALEXIN 500 MG PO CAPS: 500.0000 mg | ORAL_CAPSULE | Freq: Three times a day (TID) | ORAL | 0 refills | Status: DC

## 2021-07-31 MED ORDER — TRIAMTERENE-HCTZ 37.5-25 MG PO TABS: 1.0000 | ORAL_TABLET | ORAL | 1 refills | Status: AC

## 2021-07-31 NOTE — Discharge Instructions (Signed)
Begin taking Keflex and MiraLAX as prescribed.  Resume taking your Maxzide as before.  This medication has been refilled for you this evening.  Return to the emergency department if you develop worsening pain, high fever, bloody stools, or other new and concerning symptoms.

## 2021-07-31 NOTE — ED Provider Notes (Signed)
  Physical Exam  BP (!) 143/92   Pulse 88   Temp 99.2 F (37.3 C) (Oral)   Resp 16   Ht 5\' 3"  (1.6 m)   Wt 108.9 kg   SpO2 100%   BMI 42.51 kg/m   Physical Exam Vitals and nursing note reviewed.  Constitutional:      General: She is not in acute distress.    Appearance: She is well-developed. She is not ill-appearing.  HENT:     Head: Normocephalic and atraumatic.  Abdominal:     Tenderness: There is abdominal tenderness in the epigastric area, periumbilical area and suprapubic area. There is no right CVA tenderness, left CVA tenderness, guarding or rebound.  Neurological:     Mental Status: She is alert.    ED Course/Procedures     Procedures  MDM  Care assumed from Dr. Matilde Sprang at shift change.  Patient presenting here with chest and abdominal pain as described in his note.  Patient signed out to me awaiting results of CT scan of the chest, abdomen, and pelvis.  This showed no evidence for pulmonary embolism or other acute intrathoracic or intra-abdominal pathology.  She does have an enlarged fibroid uterus with slight hydronephrosis felt to be related to obstruction from the uterine fibroids.  Urinalysis does show evidence for UTI.  Her initial blood pressure was significantly elevated, however has improved significantly with a dose of her blood pressure medication here in the ER.  At this point, patient seems appropriate for discharge.  She will be prescribed antibiotics, her antihypertensive medication, and MiraLAX.  She is to follow-up as needed.       Veryl Speak, MD 07/31/21 [phone]

## 2022-04-03 DIAGNOSIS — R7989 Other specified abnormal findings of blood chemistry: Secondary | ICD-10-CM | POA: Diagnosis not present

## 2022-04-03 DIAGNOSIS — R7309 Other abnormal glucose: Secondary | ICD-10-CM | POA: Diagnosis not present

## 2022-04-03 DIAGNOSIS — Z0000 Encounter for general adult medical examination without abnormal findings: Secondary | ICD-10-CM | POA: Diagnosis not present

## 2022-04-03 DIAGNOSIS — I10 Essential (primary) hypertension: Secondary | ICD-10-CM | POA: Diagnosis not present

## 2022-04-05 DIAGNOSIS — S8392XA Sprain of unspecified site of left knee, initial encounter: Secondary | ICD-10-CM | POA: Diagnosis not present

## 2022-04-14 DIAGNOSIS — Z1211 Encounter for screening for malignant neoplasm of colon: Secondary | ICD-10-CM | POA: Diagnosis not present

## 2022-04-14 DIAGNOSIS — I10 Essential (primary) hypertension: Secondary | ICD-10-CM | POA: Diagnosis not present

## 2022-04-14 DIAGNOSIS — Z0000 Encounter for general adult medical examination without abnormal findings: Secondary | ICD-10-CM | POA: Diagnosis not present

## 2022-04-14 DIAGNOSIS — R7303 Prediabetes: Secondary | ICD-10-CM | POA: Diagnosis not present

## 2022-04-16 ENCOUNTER — Encounter (HOSPITAL_COMMUNITY): Payer: Self-pay

## 2022-04-16 ENCOUNTER — Emergency Department (HOSPITAL_COMMUNITY): Payer: BC Managed Care – PPO

## 2022-04-16 ENCOUNTER — Emergency Department (HOSPITAL_BASED_OUTPATIENT_CLINIC_OR_DEPARTMENT_OTHER): Payer: BC Managed Care – PPO

## 2022-04-16 ENCOUNTER — Emergency Department (HOSPITAL_COMMUNITY)
Admission: EM | Admit: 2022-04-16 | Discharge: 2022-04-16 | Disposition: A | Discharge status: Home / Self Care | Payer: BC Managed Care – PPO | Attending: Emergency Medicine | Admitting: Emergency Medicine

## 2022-04-16 ENCOUNTER — Other Ambulatory Visit: Payer: Self-pay

## 2022-04-16 DIAGNOSIS — M7989 Other specified soft tissue disorders: Secondary | ICD-10-CM | POA: Diagnosis not present

## 2022-04-16 DIAGNOSIS — M25562 Pain in left knee: Secondary | ICD-10-CM | POA: Diagnosis not present

## 2022-04-16 DIAGNOSIS — Z79899 Other long term (current) drug therapy: Secondary | ICD-10-CM | POA: Insufficient documentation

## 2022-04-16 DIAGNOSIS — M79605 Pain in left leg: Secondary | ICD-10-CM | POA: Diagnosis not present

## 2022-04-16 HISTORY — DX: Essential (primary) hypertension: I10

## 2022-04-16 NOTE — ED Provider Notes (Signed)
[facility] DEPT Provider Note   CSN: [id number] Arrival date & time: 04/16/22  0909     History Chief Complaint  Patient presents with   Leg Pain    Jeanene Mena is a 46 y.o. female otherwise healthy presents the emergency department for evaluation almost 2 weeks left knee pain.  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  She reports that after 1 night she does not rumor any particular trauma although she reports that her knees are hurting about halfway through her shift.  Because of the pain, [facility] went [facility] and was diagnosed with a knee sprain.  No x-ray imaging was done.  She was put on diclofenac and discharged home.  Patient reports that she has tried diclofenac a few times but has not noticed significant improvement with pain.  She has tried soaking it as well with minimal relief.  She reports it has just been gradually worsening.  Denies any swelling, numbness, or tingling.  Denies any estrogen or exogenous hormone use, denies any long travel, denies any history of DVT or PE, denies any personal history of cancer.   Leg Pain Associated symptoms: no fever        Home Medications Prior to Admission medications   Medication Sig Start Date End Date Taking? Authorizing Provider  cephALEXin (KEFLEX) 500 MG capsule Take 1 capsule (500 mg total) by mouth 3 (three) times daily. 07/31/21   Veryl Speak, MD  cetirizine (ZYRTEC ALLERGY) 10 MG tablet Take 1 tablet (10 mg total) by mouth daily for 14 days. 07/28/19 08/11/19  Tedd Sias, PA  diclofenac (VOLTAREN) 75 MG EC tablet Take 1 tablet (75 mg total) by mouth 2 (two) times daily. 07/21/19   Wallene Huh, DPM  ferrous sulfate 325 (65 FE) MG tablet Take 1 tablet (325 mg total) by mouth 2 (two) times daily with a meal. 10/30/14   Duffy Bruce, MD  fluticasone (FLONASE) 50 MCG/ACT nasal spray Place 1 spray into both nostrils daily. 07/28/19   Fondaw,  Kathleene Hazel, PA  guaiFENesin-dextromethorphan (ROBITUSSIN DM) 100-10 MG/5ML syrup Take 5 mLs by mouth every 4 (four) hours as needed for cough. 07/28/19   Tedd Sias, PA  ibuprofen (ADVIL,MOTRIN) 200 MG tablet Take 200 mg by mouth every 6 (six) hours as needed for mild pain. For pain     [provider]  naproxen (NAPROSYN) 500 MG tablet Take 1 tablet (500 mg total) by mouth 2 (two) times daily with a meal. 07/14/19   Caccavale, Sophia, PA-C  polyethylene glycol powder (GLYCOLAX/MIRALAX) 17 GM/SCOOP powder Take 17 g by mouth daily. 07/31/21   Veryl Speak, MD  triamterene-hydrochlorothiazide (MAXZIDE-25) 37.5-25 MG tablet Take 1 tablet by mouth every morning. 07/31/21   Veryl Speak, MD      Allergies    Patient has no known allergies.    Review of Systems   Review of Systems  Constitutional:  Negative for chills and fever.  Respiratory:  Negative for shortness of breath.   Cardiovascular:  Negative for chest pain.  Musculoskeletal:  Positive for arthralgias. Negative for joint swelling.  Neurological:  Negative for weakness and numbness.    Physical Exam Updated Vital Signs BP (!) 147/87 (BP Location: Left Arm)   Pulse 88   Temp 98 F (36.7 C) (Oral)   Resp 18   Ht '5\' 3"'$  (1.6 m)   Wt 113.4 kg  SpO2 98%   BMI 44.29 kg/m  Physical Exam Vitals and nursing note reviewed.  Constitutional:      Appearance: Normal appearance.  Eyes:     General: No scleral icterus. Pulmonary:     Effort: Pulmonary effort is normal. No respiratory distress.  Musculoskeletal:        General: Tenderness present. No swelling.     Right lower leg: No edema.     Left lower leg: No edema.     Comments: Compartments are soft.  Palpable DP and PT pulses bilaterally.  Cap refill brisk.  Patient has tenderness to the posterior aspect of her left knee.  No masses, erythema, or increased warmth noted to the area.  She can fully extend and flex her knee with some pain.  She reports the pain  starts in the posterior aspect of her knee and wraps around to the front of her shin and goes midway down the leg.  No overlying skin changes noted.  Legs appear symmetric, no swelling noted.  She is weightbearing and ambulatory on the leg.  Skin:    General: Skin is dry.     Findings: No rash.  Neurological:     General: No focal deficit present.     Mental Status: She is alert. Mental status is at baseline.  Psychiatric:        Mood and Affect: Mood normal.     ED Results / Procedures / Treatments   Labs (all labs ordered are listed, but only abnormal results are displayed) Labs Reviewed - No data to display  EKG None  Radiology VAS [place] LOWER EXTREMITY VENOUS (DVT) (7a-7p)  Result Date: 04/16/2022  Lower Venous DVT Study Patient Name:  CARMEL GARFIELD  Date of Exam:   04/16/2022 Medical Rec #: [id number]    Accession #:    [id number] Date of Birth: 1976/02/05   Patient Gender: F Patient Age:   73 years Exam Location[facility]  [facility] Procedure:      VAS [place] LOWER EXTREMITY VENOUS (DVT) Referring Phys: Kaspian Muccio --------------------------------------------------------------------------------  Indications: Knee pain and swelling for several weeks.  Limitations: Body habitus and poor ultrasound/tissue interface. Comparison Study: No previous exams Performing Technologist: Jody Hill RVT, RDMS  Examination Guidelines: A complete evaluation includes B-mode imaging, spectral Doppler, color Doppler, and power Doppler as needed of all accessible portions of each vessel. Bilateral testing is considered an integral part of a complete examination. Limited examinations for reoccurring indications may be performed as noted. The reflux portion of the exam is performed with the patient in reverse Trendelenburg.  +-----+---------------+---------+-----------+----------+--------------+ RIGHTCompressibilityPhasicitySpontaneityPropertiesThrombus Aging  +-----+---------------+---------+-----------+----------+--------------+ CFV  Full           Yes      Yes                                 +-----+---------------+---------+-----------+----------+--------------+   +---------+---------------+---------+-----------+----------+--------------+ LEFT     CompressibilityPhasicitySpontaneityPropertiesThrombus Aging +---------+---------------+---------+-----------+----------+--------------+ CFV      Full           Yes      Yes                                 +---------+---------------+---------+-----------+----------+--------------+ SFJ      Full                                                        +---------+---------------+---------+-----------+----------+--------------+  FV Prox  Full           Yes      Yes                                 +---------+---------------+---------+-----------+----------+--------------+ FV Mid   Full           Yes      Yes                                 +---------+---------------+---------+-----------+----------+--------------+ FV DistalFull           Yes      Yes                                 +---------+---------------+---------+-----------+----------+--------------+ PFV      Full                                                        +---------+---------------+---------+-----------+----------+--------------+ POP      Full           Yes      Yes                                 +---------+---------------+---------+-----------+----------+--------------+ PTV      Full                                                        +---------+---------------+---------+-----------+----------+--------------+ PERO     Full                                                        +---------+---------------+---------+-----------+----------+--------------+     Summary: RIGHT: - No evidence of common femoral vein obstruction.  LEFT: - There is no evidence of deep vein thrombosis in the  lower extremity.  - No cystic structure found in the popliteal fossa.  *See table(s) above for measurements and observations. Electronically signed by Harold Barban MD on 04/16/2022 at 11:23:00 PM.    Final    DG Knee Complete 4 Views Left  Result Date: 04/16/2022 CLINICAL DATA:  Left knee pain and swelling beginning several days ago. EXAM: LEFT KNEE - COMPLETE 4+ VIEW COMPARISON:  None FINDINGS: No evidence of fracture, dislocation, or joint effusion. No evidence of arthropathy or other focal bone abnormality. Soft tissues are unremarkable. IMPRESSION: Negative. Electronically Signed   By: Nelson Chimes M.D.   On: 04/16/2022 10:51    Procedures Procedures   Medications Ordered in ED Medications - No data to display  ED Course/ Medical Decision Making/ A&P                           Medical Decision Making Amount and/or Complexity of Data  Reviewed Radiology: ordered.   46 year old female presents the emergency room for evaluation of gradually worsening left knee/lower leg pain.  Differential diagnosis includes resolved to sprain, strain, ligament injury, muscle injury, cartilage injury, DVT, arthritis.  Vital signs show slightly elevated blood pressure otherwise normal.  Physical exam as noted above.  Will order x-ray imaging as she has not had this prior.  X-ray imaging does not show any acute fracture or abnormality.  I had a shared decision-making with the patient about ordering ultrasound to rule out any DVT.  I have I think this is unlikely but since she has pain in the posterior aspect of her knee it is only worsened whenever she stands as well as her obesity and female age, it is on my differential.  Patient would like to go ahead and proceed with an ultrasound.  Ultrasound not reveal any DVT or any cystlike structure of the leg.  Unsure of what is causing this patient's pain although it does not appear to be any deep space infection, septic arthritis, Baker's cyst, or DVT.  Patient  is safe for discharge and outpatient orthopedic follow-up.  We do not have a knee brace that would fit the patient's leg, so did opt for Ace wrap bandage.  I advised patient to stop using that the diclofenac given the cardiac risk of it.  Recommended that she use Tylenol and ibuprofen instead.  We discussed the RICE method.  I did provide her with an orthopedic follow-up.  Recommended she call to schedule an appointment.  We discussed return precautions and red flag symptoms.  Patient verbalized understanding and agreed to the plan.  Patient is stable be discharged home in good condition.   Final Clinical Impression(s) / ED Diagnoses Final diagnoses:  Acute pain of left knee    Rx / DC Orders ED Discharge Orders     None         Sherrell Puller, PA-C 04/17/22 1342    Pattricia Boss, MD 04/18/22 1459

## 2022-04-16 NOTE — Discharge Instructions (Addendum)
You were seen in the ER for evaluation of your left knee pain. Your imaging was unremarkable. No blood clot was seen. I am going to give you a referral to an orthopedic provider to follow up with. Please call to schedule an appointment. Discontinue use of your diclofenac. You can take ibuprofen '600mg'$  and/or Tylenol '1000mg'$  every 6 hours as needed for pain. I have included additional information in the discharge paperwork for you to read. If you have any concerns, new or worsening symptoms, please return to the nearest ER for evaluation.    Contact a doctor if: The knee pain does not stop. The knee pain changes or gets worse. You have a fever along with knee pain. Your knee is red or feels warm when you touch it. Your knee gives out or locks up. Get help right away if: Your knee swells, and the swelling gets worse. You cannot move your knee. You have very bad knee pain that does not get better with pain medicine.

## 2022-04-16 NOTE — Progress Notes (Signed)
LLE venous duplex has been completed.  Preliminary results given to Sherrell Puller, PA-C.   Results can be found under chart review under CV PROC. 04/16/2022 3:48 PM Raylee Strehl RVT, RDMS

## 2022-04-16 NOTE — ED Triage Notes (Signed)
Pt c/o pain to left leg and swelling since Friday. Denies injury, UC diagnosed her with a sprained knee. Pt states the pain has not improved at all.

## 2022-04-22 DIAGNOSIS — M25562 Pain in left knee: Secondary | ICD-10-CM | POA: Diagnosis not present

## 2022-04-30 DIAGNOSIS — M25562 Pain in left knee: Secondary | ICD-10-CM | POA: Diagnosis not present

## 2022-05-01 DIAGNOSIS — Z124 Encounter for screening for malignant neoplasm of cervix: Secondary | ICD-10-CM | POA: Diagnosis not present

## 2022-05-01 DIAGNOSIS — D259 Leiomyoma of uterus, unspecified: Secondary | ICD-10-CM | POA: Diagnosis not present

## 2022-05-01 DIAGNOSIS — Z202 Contact with and (suspected) exposure to infections with a predominantly sexual mode of transmission: Secondary | ICD-10-CM | POA: Diagnosis not present

## 2022-05-01 DIAGNOSIS — Z1151 Encounter for screening for human papillomavirus (HPV): Secondary | ICD-10-CM | POA: Diagnosis not present

## 2022-05-01 DIAGNOSIS — R102 Pelvic and perineal pain: Secondary | ICD-10-CM | POA: Diagnosis not present

## 2022-05-01 DIAGNOSIS — N898 Other specified noninflammatory disorders of vagina: Secondary | ICD-10-CM | POA: Diagnosis not present

## 2022-05-01 DIAGNOSIS — Z01419 Encounter for gynecological examination (general) (routine) without abnormal findings: Secondary | ICD-10-CM | POA: Diagnosis not present

## 2022-05-13 DIAGNOSIS — D259 Leiomyoma of uterus, unspecified: Secondary | ICD-10-CM | POA: Diagnosis not present

## 2022-05-13 DIAGNOSIS — R102 Pelvic and perineal pain: Secondary | ICD-10-CM | POA: Diagnosis not present

## 2022-06-11 DIAGNOSIS — D259 Leiomyoma of uterus, unspecified: Secondary | ICD-10-CM | POA: Diagnosis not present

## 2022-06-11 DIAGNOSIS — N1330 Unspecified hydronephrosis: Secondary | ICD-10-CM | POA: Diagnosis not present

## 2022-06-11 DIAGNOSIS — N939 Abnormal uterine and vaginal bleeding, unspecified: Secondary | ICD-10-CM | POA: Diagnosis not present

## 2022-06-17 DIAGNOSIS — M25562 Pain in left knee: Secondary | ICD-10-CM | POA: Diagnosis not present

## 2022-07-08 DIAGNOSIS — M25562 Pain in left knee: Secondary | ICD-10-CM | POA: Diagnosis not present

## 2022-07-15 DIAGNOSIS — Z3202 Encounter for pregnancy test, result negative: Secondary | ICD-10-CM | POA: Diagnosis not present

## 2022-07-15 DIAGNOSIS — N939 Abnormal uterine and vaginal bleeding, unspecified: Secondary | ICD-10-CM | POA: Diagnosis not present

## 2022-07-15 DIAGNOSIS — D259 Leiomyoma of uterus, unspecified: Secondary | ICD-10-CM | POA: Diagnosis not present

## 2022-07-16 ENCOUNTER — Other Ambulatory Visit: Payer: Self-pay | Admitting: Obstetrics and Gynecology

## 2022-07-16 DIAGNOSIS — D250 Submucous leiomyoma of uterus: Secondary | ICD-10-CM

## 2022-07-17 ENCOUNTER — Other Ambulatory Visit: Payer: Self-pay | Admitting: Interventional Radiology

## 2022-07-17 ENCOUNTER — Ambulatory Visit
Admission: RE | Admit: 2022-07-17 | Discharge: 2022-07-17 | Disposition: A | Discharge status: Home / Self Care | Payer: BC Managed Care – PPO | Source: Ambulatory Visit | Attending: Obstetrics and Gynecology | Admitting: Obstetrics and Gynecology

## 2022-07-17 DIAGNOSIS — D259 Leiomyoma of uterus, unspecified: Secondary | ICD-10-CM | POA: Diagnosis not present

## 2022-07-17 DIAGNOSIS — M25562 Pain in left knee: Secondary | ICD-10-CM | POA: Diagnosis not present

## 2022-07-17 DIAGNOSIS — D250 Submucous leiomyoma of uterus: Secondary | ICD-10-CM

## 2022-07-17 DIAGNOSIS — N920 Excessive and frequent menstruation with regular cycle: Secondary | ICD-10-CM | POA: Diagnosis not present

## 2022-07-17 HISTORY — PX: IR RADIOLOGIST EVAL & MGMT: IMG5224

## 2022-07-17 NOTE — Consult Note (Signed)
Chief Complaint: Patient was seen in virtual telephone clinic consultation today for symptomatic uterine fibroids  Referring Physician(s): Davies,Melissa  History of Present Illness: Diane Ramsey is a 46 y.o. G3P3 female with history of symptomatic uterine fibroids graciously referred for consideration of uterine artery embolization by Dr. Delora Fuel.  Diane Ramsey reports history of severely abnormal uterine bleeding which was worst in 2019 and has improved some, but persistent.  She also reports stomach swelling, worse 2 weeks prior to menstrual cycle, and states she looks 5-6 months pregnant.  There is associated painful swelling and tenderness which lasts through her cycle.  She has a history of anemia, never requiring transfusion.  She endorses bulk symptoms which include constant constipation requiring daily stool softeners and occasional enemas, urinary frequency, incomplete emptying, and urgency.  She is averse to hysterectomy as she has had 3 prior cesarian sections and is fearful of the risk of surgical difficulty due to adhesions/scarring as well as the recovery.  She has 3 children and 2 grandchildren.  She works as a Optician, dispensing.  Past Medical History:  Diagnosis Date   Hypertension    Lupus ([place])     Past Surgical History:  Procedure Laterality Date   CESAREAN SECTION      Allergies: Patient has no known allergies.  Medications: Prior to Admission medications   Medication Sig Start Date End Date Taking? Authorizing Provider  cephALEXin (KEFLEX) 500 MG capsule Take 1 capsule (500 mg total) by mouth 3 (three) times daily. 07/31/21   Veryl Speak, MD  cetirizine (ZYRTEC ALLERGY) 10 MG tablet Take 1 tablet (10 mg total) by mouth daily for 14 days. 07/28/19 08/11/19  Tedd Sias, PA  diclofenac (VOLTAREN) 75 MG EC tablet Take 1 tablet (75 mg total) by mouth 2 (two) times daily. 07/21/19   Wallene Huh, DPM  ferrous sulfate 325 (65 FE) MG tablet Take 1 tablet (325 mg  total) by mouth 2 (two) times daily with a meal. 10/30/14   Duffy Bruce, MD  fluticasone (FLONASE) 50 MCG/ACT nasal spray Place 1 spray into both nostrils daily. 07/28/19   Fondaw, Kathleene Hazel, PA  guaiFENesin-dextromethorphan (ROBITUSSIN DM) 100-10 MG/5ML syrup Take 5 mLs by mouth every 4 (four) hours as needed for cough. 07/28/19   Tedd Sias, PA  ibuprofen (ADVIL,MOTRIN) 200 MG tablet Take 200 mg by mouth every 6 (six) hours as needed for mild pain. For pain     [provider]  naproxen (NAPROSYN) 500 MG tablet Take 1 tablet (500 mg total) by mouth 2 (two) times daily with a meal. 07/14/19   Caccavale, Sophia, PA-C  polyethylene glycol powder (GLYCOLAX/MIRALAX) 17 GM/SCOOP powder Take 17 g by mouth daily. 07/31/21   Veryl Speak, MD  triamterene-hydrochlorothiazide (MAXZIDE-25) 37.5-25 MG tablet Take 1 tablet by mouth every morning. 07/31/21   Veryl Speak, MD     Family History  Problem Relation Age of Onset   Lupus Son    Diabetes Father    Stroke Sister     Social History   Socioeconomic History   Marital status: Married    Spouse name: Not on file   Number of children: Not on file   Years of education: Not on file   Highest education level: Not on file  Occupational History   Not on file  Tobacco Use   Smoking status: Never   Smokeless tobacco: Never  Substance and Sexual Activity   Alcohol use: No   Drug use: No  Sexual activity: Not on file  Other Topics Concern   Not on file  Social History Narrative   Not on file   Social Determinants of Health   Financial Resource Strain: Not on file  Food Insecurity: Not on file  Transportation Needs: Not on file  Physical Activity: Not on file  Stress: Not on file  Social Connections: Not on file    Review of Systems: A 12 point ROS discussed and pertinent positives are indicated in the HPI above.  All other systems are negative.  Vital Signs: There were no vitals taken for this visit.  No physical  examination was performed in lieu of virtual telephone clinic visit.  Imaging: CT AP 07/31/21  Leiomyomatous uterus.  No MRI.  Labs:  CBC: Recent Labs    07/30/21 2315  WBC 7.8  HGB 11.9*  HCT 37.9  PLT 235    COAGS: No results for input(s): "INR", "APTT" in the last 8760 hours.  BMP: Recent Labs    07/30/21 2315  NA 136  K 3.9  CL 106  CO2 24  GLUCOSE 116*  BUN 15  CALCIUM 8.7*  CREATININE 0.87  GFRNONAA >60    LIVER FUNCTION TESTS: Recent Labs    07/30/21 2315  BILITOT 0.6  AST 18  ALT 13  ALKPHOS 53  PROT 7.5  ALBUMIN 3.7     Assessment: Diane Ramsey is a 46 y.o. female presenting to IR clinic for discussion of symptomatic uterine fibroids.  Her primary symptoms are menorrhagia and bulk symptoms, and she has previously tried nothing.  The patient was counseled on options for treatment of uterine fibroids with their accompanying symptoms of heavy menses, painful periods, and/or bulk symptoms including doing nothing, hormonal therapy, myomectomy, hysterectomy, and uterine artery embolization.  We discussed uterine artery embolization ([place]) as a potential therapy.  We described the procedure itself, including the possibility of using analgesia for pain control, a bladder catheter, and admission to the hospital for a 23 hour inpatient observation period.  The procedure is done under conscious sedation.  We quoted an efficacy of 90% for improvement of menorrhagia and of 75% for improvement of bulk symptoms at one year related to fibroids.  We informed the patient that approximately 80% of those patients described sustained benefits from the procedure at 5 years.  We discussed secondary hysterectomy rates for persistent symptoms of 3%, 4%, 10%, and 17% at 1, 2, 3, and 5 years, respectively.  IF the patient also had adenomyosis as a potential contributing cause for her symptoms, we quoted a higher rate of recurrent symptoms after [place], being 30-50% at 3-5 years.     We also quoted a 1 in 250-500 (0.2-0.4%) incidence of requiring emergent or semi-emergent hysterectomy due a procedural complication, as well an as approximately 7% chance of early onset menopause, mostly in women >78 years of age, with a less than 1% risk for women less than 13 years of age.  We discussed the need to evaluate the ovarian arteries and in some cases the need to treat the fibroids via the ovarian arteries which can lead to a higher risk of early menopause.  We also discussed the risk of an occult malignancy in patients with symptomatic fibroids being anywhere from 1 in 350 to 1 in 8,000 (0.0125-0.3%), depending upon the patient's age, symptom complex, family risk factors, and appearance of the fibroid on her MRI study.  We also briefly discussed surgical options of treatment offered by our Gynecology colleagues  with hysterectomy, myomectomy and/or endometrial ablation.    After her visit today she is most interested in pursuing uterine artery embolization.   Plan: -proceed with uterine artery embolization -will require MRI pelvis prior to procedure   Electronically Signed: Suzette Battiest, MD 07/17/2022, 10:59 AM   I spent a total of  40 Minutes  in virtual telephone clinical consultation, greater than 50% of which was counseling/coordinating care for symptomatic uterine fibroids.

## 2022-07-20 ENCOUNTER — Ambulatory Visit (HOSPITAL_COMMUNITY)
Admission: RE | Admit: 2022-07-20 | Discharge: 2022-07-20 | Disposition: A | Discharge status: Home / Self Care | Payer: BC Managed Care – PPO | Source: Ambulatory Visit | Attending: Interventional Radiology | Admitting: Interventional Radiology

## 2022-07-20 DIAGNOSIS — D252 Subserosal leiomyoma of uterus: Secondary | ICD-10-CM | POA: Diagnosis not present

## 2022-07-20 DIAGNOSIS — D250 Submucous leiomyoma of uterus: Secondary | ICD-10-CM | POA: Insufficient documentation

## 2022-07-20 DIAGNOSIS — N852 Hypertrophy of uterus: Secondary | ICD-10-CM | POA: Diagnosis not present

## 2022-07-20 MED ORDER — GADOBUTROL 1 MMOL/ML IV SOLN
10.0000 mL | Freq: Once | INTRAVENOUS | Status: AC | PRN
  Administered 2022-07-20: 10 mL via INTRAVENOUS

## 2022-07-25 ENCOUNTER — Other Ambulatory Visit (HOSPITAL_COMMUNITY): Payer: Self-pay | Admitting: Interventional Radiology

## 2022-07-25 ENCOUNTER — Telehealth (HOSPITAL_COMMUNITY): Payer: Self-pay | Admitting: Radiology

## 2022-07-25 DIAGNOSIS — D259 Leiomyoma of uterus, unspecified: Secondary | ICD-10-CM

## 2022-07-25 NOTE — Telephone Encounter (Signed)
Called pt to schedule UFE for 11/22 with Dr. Serafina Royals at [facility] for a 12 pm procedure. Left VM for her to call myself or Caryl Pina back to let us know if she wants this appt. JM

## 2022-07-29 ENCOUNTER — Other Ambulatory Visit: Payer: Self-pay | Admitting: Radiology

## 2022-07-29 DIAGNOSIS — D259 Leiomyoma of uterus, unspecified: Secondary | ICD-10-CM

## 2022-07-30 ENCOUNTER — Other Ambulatory Visit (HOSPITAL_COMMUNITY): Payer: Self-pay | Admitting: Interventional Radiology

## 2022-07-30 ENCOUNTER — Ambulatory Visit (HOSPITAL_COMMUNITY)
Admission: RE | Admit: 2022-07-30 | Discharge: 2022-07-30 | Disposition: A | Discharge status: Home / Self Care | Payer: BC Managed Care – PPO | Source: Ambulatory Visit | Attending: Interventional Radiology | Admitting: Interventional Radiology

## 2022-07-30 ENCOUNTER — Other Ambulatory Visit: Payer: Self-pay

## 2022-07-30 ENCOUNTER — Other Ambulatory Visit: Payer: Self-pay | Admitting: Radiology

## 2022-07-30 VITALS — BP 131/95 | HR 93 | Temp 97.6°F | Resp 16 | Ht 63.0 in | Wt 240.0 lb

## 2022-07-30 DIAGNOSIS — I10 Essential (primary) hypertension: Secondary | ICD-10-CM | POA: Insufficient documentation

## 2022-07-30 DIAGNOSIS — D259 Leiomyoma of uterus, unspecified: Secondary | ICD-10-CM | POA: Diagnosis not present

## 2022-07-30 DIAGNOSIS — D219 Benign neoplasm of connective and other soft tissue, unspecified: Secondary | ICD-10-CM

## 2022-07-30 DIAGNOSIS — Z01812 Encounter for preprocedural laboratory examination: Secondary | ICD-10-CM

## 2022-07-30 HISTORY — PX: IR EMBO ARTERIAL NOT HEMORR HEMANG INC GUIDE ROADMAPPING: IMG5448

## 2022-07-30 HISTORY — PX: IR US GUIDE VASC ACCESS LEFT: IMG2389

## 2022-07-30 HISTORY — PX: IR ANGIOGRAM PELVIS SELECTIVE OR SUPRASELECTIVE: IMG661

## 2022-07-30 LAB — CBC
HCT: 40.3 % (ref 36.0–46.0)
Hemoglobin: 12.5 g/dL (ref 12.0–15.0)
MCH: 28.2 pg (ref 26.0–34.0)
MCHC: 31 g/dL (ref 30.0–36.0)
MCV: 91 fL (ref 80.0–100.0)
Platelets: 247 10*3/uL (ref 150–400)
RBC: 4.43 MIL/uL (ref 3.87–5.11)
RDW: 14.2 % (ref 11.5–15.5)
WBC: 6.4 10*3/uL (ref 4.0–10.5)
nRBC: 0 % (ref 0.0–0.2)

## 2022-07-30 LAB — BASIC METABOLIC PANEL
Anion gap: 7 (ref 5–15)
BUN: 15 mg/dL (ref 6–20)
CO2: 26 mmol/L (ref 22–32)
Calcium: 9.2 mg/dL (ref 8.9–10.3)
Chloride: 106 mmol/L (ref 98–111)
Creatinine, Ser: 0.95 mg/dL (ref 0.44–1.00)
GFR, Estimated: >60 mL/min (ref >60)
Glucose, Bld: 87 mg/dL (ref 70–99)
Potassium: 3.8 mmol/L (ref 3.5–5.1)
Sodium: 139 mmol/L (ref 135–145)

## 2022-07-30 LAB — PROTIME-INR
INR: 1 (ref 0.8–1.2)
Prothrombin Time: 13.1 seconds (ref 11.4–15.2)

## 2022-07-30 MED ORDER — CEFAZOLIN SODIUM-DEXTROSE 2-4 GM/100ML-% IV SOLN
INTRAVENOUS | Status: AC
  Administered 2022-07-30: 2 g via INTRAVENOUS
  Filled 2022-07-30: qty 100

## 2022-07-30 MED ORDER — IBUPROFEN 200 MG PO TABS: 200.0000 mg | ORAL_TABLET | Freq: Three times a day (TID) | ORAL | 0 refills | Status: AC

## 2022-07-30 MED ORDER — CEFAZOLIN SODIUM-DEXTROSE 2-4 GM/100ML-% IV SOLN: 2.0000 g | INTRAVENOUS | Status: AC

## 2022-07-30 MED ORDER — "NITROGLYCERIN NICU 2% OINTMENT ": TOPICAL_OINTMENT | TRANSDERMAL | Status: DC

## 2022-07-30 MED ORDER — KETOROLAC TROMETHAMINE 60 MG/2ML IM SOLN
INTRAMUSCULAR | Status: AC | PRN
  Administered 2022-07-30: 30 mg via INTRAMUSCULAR

## 2022-07-30 MED ORDER — KETOROLAC TROMETHAMINE 30 MG/ML IJ SOLN
INTRAMUSCULAR | Status: AC | PRN
  Administered 2022-07-30: 30 mg via INTRAVENOUS

## 2022-07-30 MED ORDER — LIDOCAINE-PRILOCAINE 2.5-2.5 % EX CREA: TOPICAL_CREAM | Freq: Once | CUTANEOUS | Status: DC

## 2022-07-30 MED ORDER — IOHEXOL 300 MG/ML  SOLN
100.0000 mL | Freq: Once | INTRAMUSCULAR | Status: AC | PRN
  Administered 2022-07-30: 20 mL via INTRA_ARTERIAL

## 2022-07-30 MED ORDER — LIDOCAINE 4 % EX CREA
TOPICAL_CREAM | CUTANEOUS | Status: AC
  Administered 2022-07-30: 1 via TOPICAL
  Filled 2022-07-30 (×2): qty 5

## 2022-07-30 MED ORDER — DEXAMETHASONE SODIUM PHOSPHATE 10 MG/ML IJ SOLN
10.0000 mg | INTRAMUSCULAR | Status: AC
  Administered 2022-07-30: 10 mg via INTRAVENOUS
  Filled 2022-07-30 (×2): qty 1

## 2022-07-30 MED ORDER — SODIUM CHLORIDE 0.9 % IV SOLN
8.0000 mg | INTRAVENOUS | Status: AC
  Administered 2022-07-30: 8 mg via INTRAVENOUS
  Filled 2022-07-30: qty 4

## 2022-07-30 MED ORDER — OXYCODONE-ACETAMINOPHEN 5-325 MG PO TABS: 1.0000 | ORAL_TABLET | ORAL | 0 refills | Status: AC | PRN

## 2022-07-30 MED ORDER — IOHEXOL 300 MG/ML  SOLN
100.0000 mL | Freq: Once | INTRAMUSCULAR | Status: AC | PRN
  Administered 2022-07-30: 56 mL

## 2022-07-30 MED ORDER — SODIUM CHLORIDE 0.9 % IV SOLN: INTRAVENOUS | Status: DC

## 2022-07-30 MED ORDER — VERAPAMIL HCL 2.5 MG/ML IV SOLN: INTRA_ARTERIAL | Status: AC | PRN

## 2022-07-30 MED ORDER — ACETAMINOPHEN 500 MG PO TABS
1000.0000 mg | ORAL_TABLET | ORAL | Status: AC
  Administered 2022-07-30: 1000 mg via ORAL
  Filled 2022-07-30: qty 2

## 2022-07-30 MED ORDER — NITROGLYCERIN 1 MG/10 ML FOR IR/CATH LAB
INTRA_ARTERIAL | Status: AC
  Filled 2022-07-30: qty 10

## 2022-07-30 MED ORDER — MIDAZOLAM HCL 2 MG/2ML IJ SOLN
INTRAMUSCULAR | Status: AC
  Filled 2022-07-30: qty 4

## 2022-07-30 MED ORDER — HEPARIN SODIUM (PORCINE) 1000 UNIT/ML IJ SOLN
INTRAMUSCULAR | Status: AC
  Filled 2022-07-30: qty 10

## 2022-07-30 MED ORDER — KETOROLAC TROMETHAMINE 30 MG/ML IJ SOLN
INTRAMUSCULAR | Status: AC
  Filled 2022-07-30: qty 2

## 2022-07-30 MED ORDER — DOCUSATE SODIUM 100 MG PO CAPS: 100.0000 mg | ORAL_CAPSULE | Freq: Two times a day (BID) | ORAL | 0 refills | Status: AC

## 2022-07-30 MED ORDER — MIDAZOLAM HCL 2 MG/2ML IJ SOLN
INTRAMUSCULAR | Status: AC | PRN
  Administered 2022-07-30 (×4): 1 mg via INTRAVENOUS

## 2022-07-30 MED ORDER — LIDOCAINE-EPINEPHRINE 1 %-1:100000 IJ SOLN
INTRAMUSCULAR | Status: AC
  Administered 2022-07-30: 5 mL
  Filled 2022-07-30: qty 1

## 2022-07-30 MED ORDER — FENTANYL CITRATE (PF) 100 MCG/2ML IJ SOLN
INTRAMUSCULAR | Status: AC
  Filled 2022-07-30: qty 4

## 2022-07-30 MED ORDER — VERAPAMIL HCL 2.5 MG/ML IV SOLN
INTRAVENOUS | Status: AC
  Filled 2022-07-30: qty 2

## 2022-07-30 MED ORDER — HYDROCODONE-ACETAMINOPHEN 5-325 MG PO TABS: 1.0000 | ORAL_TABLET | ORAL | Status: DC | PRN

## 2022-07-30 MED ORDER — KETOROLAC TROMETHAMINE 10 MG PO TABS: 10.0000 mg | ORAL_TABLET | Freq: Four times a day (QID) | ORAL | 0 refills | Status: AC

## 2022-07-30 MED ORDER — HYDROMORPHONE HCL 1 MG/ML IJ SOLN
INTRAMUSCULAR | Status: AC
  Filled 2022-07-30: qty 1

## 2022-07-30 MED ORDER — PROMETHAZINE HCL 12.5 MG PO TABS: 12.5000 mg | ORAL_TABLET | ORAL | 0 refills | Status: AC | PRN

## 2022-07-30 MED ORDER — OXYCODONE HCL 5 MG PO TABS
10.0000 mg | ORAL_TABLET | ORAL | Status: AC
  Administered 2022-07-30: 10 mg via ORAL
  Filled 2022-07-30: qty 2

## 2022-07-30 MED ORDER — NITROGLYCERIN 2 % TD OINT
1.0000 [in_us] | TOPICAL_OINTMENT | Freq: Once | TRANSDERMAL | Status: AC
  Administered 2022-07-30: 1 [in_us] via TOPICAL
  Filled 2022-07-30 (×2): qty 1

## 2022-07-30 MED ORDER — ONDANSETRON HCL 4 MG/2ML IJ SOLN: 4.0000 mg | Freq: Four times a day (QID) | INTRAMUSCULAR | Status: DC | PRN

## 2022-07-30 MED ORDER — FENTANYL CITRATE (PF) 100 MCG/2ML IJ SOLN
INTRAMUSCULAR | Status: AC | PRN
  Administered 2022-07-30 (×4): 50 ug via INTRAVENOUS

## 2022-07-30 MED ORDER — IOHEXOL 300 MG/ML  SOLN
150.0000 mL | Freq: Once | INTRAMUSCULAR | Status: AC | PRN
  Administered 2022-07-30: 150 mL via INTRAVENOUS

## 2022-07-30 NOTE — Procedures (Signed)
Interventional Radiology Procedure Note  Procedure: Bilateral uterine artery embolization  Findings: Please refer to procedural dictation for full description. 600 micron Hydropeals bilaterally.  Left radial access with TR band closure - 12 cc '@13'$ :50.  Complications: None immediate  Estimated Blood Loss: < 5 mL  Recommendations: Bedrest for 1 hour. Advance diet as tolerated. Plan for discharge home later today. Follow up in IR clinic in 2-3 weeks.   Ruthann Cancer, MD Pager: [phone]

## 2022-07-30 NOTE — H&P (Signed)
Chief Complaint: Patient was seen in consultation today for uterine artery embolization  Referring Physician(s): Drema Dallas, DO  Supervising Physician: Ruthann Cancer  Patient Status: [facility] - Out-pt  History of Present Illness: Diane Ramsey is a 46 y.o. female with a past medical history significant for lupus, HTN and symptomatic uterine fibroids who presents today for uterine artery embolization. Diane Ramsey was seen in consultation by Dr. Serafina Royals 07/17/22 -- please see this consult note for full details. Briefly, Diane Ramsey has had been experiencing severe abnormal uterine bleeding and abdominal bloating with tenderness during her menstrual cycle since 2019. After thorough discussion with Dr. Serafina Royals decision was made to proceed with [place] for menorrhagia and bulk symptom relief for which she presents today.   Diane Ramsey denies any complaints, she understands the procedure today as well as pain expectations post procedure. She is aware that this procedure may affect chances of becoming pregnant and reports that she has had a tubal ligation and does not desire future pregnancy. She remains agreeable to proceed.  Past Medical History:  Diagnosis Date   Hypertension    Lupus ([place])     Past Surgical History:  Procedure Laterality Date   CESAREAN SECTION     IR RADIOLOGIST EVAL & MGMT  07/17/2022    Allergies: Patient has no known allergies.  Medications: Prior to Admission medications   Medication Sig Start Date End Date Taking? Authorizing Provider  Ascorbic Acid (VITAMIN C PO) Take 1 tablet by mouth every other day. Alternate days with multivitamin   Yes [provider]  cyclobenzaprine (FLEXERIL) 10 MG tablet Take 10 mg by mouth daily as needed for muscle spasms. 07/17/22  Yes [provider]  ibuprofen (ADVIL,MOTRIN) 200 MG tablet Take 800 mg by mouth every 6 (six) hours as needed for mild pain. For pain   Yes [provider]  Multiple Vitamin (MULTIVITAMIN  WITH MINERALS) TABS tablet Take 1 tablet by mouth every other day. Alternate days with vitamin c   Yes [provider]  polyethylene glycol powder (GLYCOLAX/MIRALAX) 17 GM/SCOOP powder Take 17 g by mouth daily. Patient taking differently: Take 17 g by mouth daily as needed for moderate constipation. 07/31/21  Yes Delo, Nathaneil Canary, MD  triamterene-hydrochlorothiazide (MAXZIDE-25) 37.5-25 MG tablet Take 1 tablet by mouth every morning. 07/31/21  Yes Veryl Speak, MD     Family History  Problem Relation Age of Onset   Lupus Son    Diabetes Father    Stroke Sister     Social History   Socioeconomic History   Marital status: Married    Spouse name: Not on file   Number of children: Not on file   Years of education: Not on file   Highest education level: Not on file  Occupational History   Not on file  Tobacco Use   Smoking status: Never   Smokeless tobacco: Never  Substance and Sexual Activity   Alcohol use: No   Drug use: No   Sexual activity: Not on file  Other Topics Concern   Not on file  Social History Narrative   Not on file   Social Determinants of Health   Financial Resource Strain: Not on file  Food Insecurity: Not on file  Transportation Needs: Not on file  Physical Activity: Not on file  Stress: Not on file  Social Connections: Not on file     Review of Systems: A 12 point ROS discussed and pertinent positives are indicated in the HPI above.  All other systems are negative.  Review of Systems  Constitutional:  Negative for chills and fever.  Respiratory:  Negative for cough and shortness of breath.   Cardiovascular:  Negative for chest pain.  Gastrointestinal:  Negative for abdominal pain, nausea and vomiting.  Musculoskeletal:  Negative for back pain.  Neurological:  Negative for dizziness and headaches.    Vital Signs: BP 127/84   Pulse 88   Temp 97.6 F (36.4 C) (Temporal)   Resp 17   Ht '5\' 3"'$  (1.6 m)   Wt 240 lb (108.9 kg)   SpO2 100%    BMI 42.51 kg/m   Physical Exam Vitals reviewed.  Constitutional:      General: She is not in acute distress. HENT:     Head: Normocephalic.     Mouth/Throat:     Mouth: Mucous membranes are moist.     Pharynx: Oropharynx is clear. No oropharyngeal exudate or posterior oropharyngeal erythema.  Cardiovascular:     Rate and Rhythm: Normal rate and regular rhythm.  Pulmonary:     Effort: Pulmonary effort is normal.     Breath sounds: Normal breath sounds.  Abdominal:     General: There is no distension.     Palpations: Abdomen is soft.     Tenderness: There is no abdominal tenderness.  Skin:    General: Skin is warm and dry.  Neurological:     Mental Status: She is alert and oriented to person, place, and time.  Psychiatric:        Mood and Affect: Mood normal.        Behavior: Behavior normal.        Thought Content: Thought content normal.        Judgment: Judgment normal.      MD Evaluation Airway: WNL Heart: WNL Abdomen: WNL Chest/ Lungs: WNL ASA  Classification: 2 Mallampati/Airway Score: One   Imaging: MR PELVIS W WO CONTRAST  Result Date: 07/21/2022 CLINICAL DATA:  Symptomatic uterine fibroids.  Treatment planning. EXAM: MRI PELVIS WITHOUT AND WITH CONTRAST TECHNIQUE: Multiplanar multisequence MR imaging of the pelvis was performed both before and after administration of intravenous contrast. CONTRAST:  29m GADAVIST GADOBUTROL 1 MMOL/ML IV SOLN COMPARISON:  None Available. FINDINGS: Lower Urinary Tract: No bladder or urethral abnormality identified. Bowel:  Unremarkable visualized pelvic bowel loops. Vascular/Lymphatic: No pathologically enlarged lymph nodes or other significant abnormality. Reproductive: -- Uterus: Measures 19.3 x 9.9 x 15.5 cm (volume = 1600 cm^3). Numerous fibroids are seen throughout the uterus which are intramural and submucosal in location. The largest fibroid is seen in the posterior uterine corpus measures 9.3 cm in maximum diameter. A  subserosal fibroid is also seen in the right lateral corpus measuring 7.4 cm in maximum diameter. -- Intracavitary fibroids:  None. -- Pedunculated fibroids: None. -- Fibroid contrast enhancement: All fibroids show contrast enhancement, without significant degeneration/devascularization. -- Right ovary:  Appears normal.  No mass identified. -- Left ovary:  Appears normal.  No mass identified. Other: No abnormal free fluid. Musculoskeletal:  Unremarkable. IMPRESSION: Markedly enlarged uterus with diffuse involvement fibroids measuring up to 9.3 cm. No intracavitary or pedunculated fibroids identified. Normal appearance of both ovaries. No adnexal mass identified. Electronically Signed   By: JMarlaine HindM.D.   On: 07/21/2022 15:36   IR Radiologist Eval & Mgmt  Result Date: 07/17/2022 EXAM: NEW PATIENT OFFICE VISIT CHIEF COMPLAINT: See Epic note. HISTORY OF PRESENT ILLNESS: See Epic note. REVIEW OF SYSTEMS: See Epic note. PHYSICAL EXAMINATION: See  Epic note. ASSESSMENT AND PLAN: See Epic note. Ruthann Cancer, MD [facility] Electronically Signed   By: Ruthann Cancer M.D.   On: 07/17/2022 14:19    Labs:  CBC: Recent Labs    07/30/21 2315  WBC 7.8  HGB 11.9*  HCT 37.9  PLT 235    COAGS: No results for input(s): "INR", "APTT" in the last 8760 hours.  BMP: Recent Labs    07/30/21 2315  NA 136  K 3.9  CL 106  CO2 24  GLUCOSE 116*  BUN 15  CALCIUM 8.7*  CREATININE 0.87  GFRNONAA >60    LIVER FUNCTION TESTS: Recent Labs    07/30/21 2315  BILITOT 0.6  AST 18  ALT 13  ALKPHOS 53  PROT 7.5  ALBUMIN 3.7    TUMOR MARKERS: No results for input(s): "AFPTM", "CEA", "CA199", "CHROMGRNA" in the last 8760 hours.  Assessment and Plan:  46 y/o F with history of menorrhagia with bulk symptoms who presents today for uterine artery embolization.    Risks and benefits of procedure were discussed with the patient including, but not  limited to bleeding, infection, vascular injury or contrast induced renal failure.   This interventional procedure involves the use of X-rays and because of the nature of the planned procedure, it is possible that we will have prolonged use of X-ray fluoroscopy.   Potential radiation risks to you include (but are not limited to) the following: - A slightly elevated risk for cancer several years later in life. This risk is typically less than 0.5% percent. This risk is low in comparison to the normal incidence of human cancer, which is 33% for women and 50% for men according to the [place].  - Radiation induced injury can include skin redness, resembling a rash, tissue breakdown / ulcers and hair loss (which can be temporary or permanent).    The likelihood of either of these occurring depends on the difficulty of the procedure and whether you are sensitive to radiation due to previous procedures, disease, or genetic conditions.    IF your procedure requires a prolonged use of radiation, you will be notified and given written instructions for further action.  It is your responsibility to monitor the irradiated area for the 2 weeks following the procedure and to notify your physician if you are concerned that you have suffered a radiation induced injury.     All of the patient's questions were answered, patient is agreeable to proceed.   Consent signed and in chart.   Thank you for this interesting consult.  I greatly enjoyed meeting Camya Haydon and look forward to participating in their care.  A copy of this report was sent to the requesting provider on this date.  Electronically Signed: Joaquim Nam, PA-C 07/30/2022, 11:16 AM   I spent a total of 3 25 Minutes in face to face in clinical consultation, greater than 50% of which was counseling/coordinating care for uterine artery embolization.

## 2022-07-30 NOTE — Progress Notes (Signed)
Called Main Lab to inquire about the status of labs. Was told "they are on the machine".

## 2022-08-08 ENCOUNTER — Other Ambulatory Visit: Payer: Self-pay | Admitting: Interventional Radiology

## 2022-08-08 DIAGNOSIS — D250 Submucous leiomyoma of uterus: Secondary | ICD-10-CM

## 2022-08-15 ENCOUNTER — Ambulatory Visit
Admission: RE | Admit: 2022-08-15 | Discharge: 2022-08-15 | Disposition: A | Discharge status: Home / Self Care | Payer: Self-pay | Source: Ambulatory Visit | Attending: Interventional Radiology | Admitting: Interventional Radiology

## 2022-08-15 DIAGNOSIS — D250 Submucous leiomyoma of uterus: Secondary | ICD-10-CM

## 2022-08-15 HISTORY — PX: IR RADIOLOGIST EVAL & MGMT: IMG5224

## 2022-08-15 NOTE — Progress Notes (Signed)
Reason for follow up: Patient was seen in virtual telephone clinic consultation today for symptomatic uterine fibroids status post uterine artery embolization on 07/30/22   Referring Physician(s): Davies,Melissa   History of Present Illness: Diane Ramsey is a 46 y.o. G3P3 female with history of symptomatic uterine fibroids graciously referred for consideration of uterine artery embolization by Dr. Delora Fuel.  Mrs. Middlebrooks reports history of severely abnormal uterine bleeding which was worst in 2019 and has improved some, but persistent.  She also reports stomach swelling, worse 2 weeks prior to menstrual cycle, and states she looks 5-6 months pregnant.  There is associated painful swelling and tenderness which lasts through her cycle.  She has a history of anemia, never requiring transfusion.  She endorses bulk symptoms which include constant constipation requiring daily stool softeners and occasional enemas, urinary frequency, incomplete emptying, and urgency.   She is averse to hysterectomy as she has had 3 prior cesarian sections and is fearful of the risk of surgical difficulty due to adhesions/scarring as well as the recovery.   She has 3 children and 2 grandchildren.  She works as a Optician, dispensing.  Feels well after procedure.  Minimum pain after the procedure.  Was able to stop taking post-procedural pain medication after a couple of days.  Says she feels back to 100%.  Some pain at radial access site after procedure, but resolved.  No cycle since procedure, expects to start in the next few days.  States that her stomach feels smaller already.    Past Medical History:  Diagnosis Date   Hypertension    Lupus ([place])     Past Surgical History:  Procedure Laterality Date   CESAREAN SECTION     IR ANGIOGRAM PELVIS SELECTIVE OR SUPRASELECTIVE  07/30/2022   IR ANGIOGRAM PELVIS SELECTIVE OR SUPRASELECTIVE  07/30/2022   IR EMBO ARTERIAL NOT HEMORR HEMANG INC GUIDE ROADMAPPING  07/30/2022   IR  RADIOLOGIST EVAL & MGMT  07/17/2022   IR US GUIDE VASC ACCESS LEFT  07/30/2022    Allergies: Patient has no known allergies.  Medications: Prior to Admission medications   Medication Sig Start Date End Date Taking? Authorizing Provider  Ascorbic Acid (VITAMIN C PO) Take 1 tablet by mouth every other day. Alternate days with multivitamin    [provider]  cyclobenzaprine (FLEXERIL) 10 MG tablet Take 10 mg by mouth daily as needed for muscle spasms. 07/17/22   [provider]  docusate sodium (COLACE) 100 MG capsule Take 1 capsule (100 mg total) by mouth 2 (two) times daily. Use while taking oxycodone. 07/30/22 08/29/22  Candiss Norse A, PA-C  ibuprofen (ADVIL,MOTRIN) 200 MG tablet Take 800 mg by mouth every 6 (six) hours as needed for mild pain. For pain    [provider]  Multiple Vitamin (MULTIVITAMIN WITH MINERALS) TABS tablet Take 1 tablet by mouth every other day. Alternate days with vitamin c    [provider]  oxyCODONE-acetaminophen (PERCOCET) 5-325 MG tablet Take 1 tablet by mouth every 4 (four) hours as needed for severe pain (For breakthrough pain). 07/30/22   Candiss Norse A, PA-C  polyethylene glycol powder (GLYCOLAX/MIRALAX) 17 GM/SCOOP powder Take 17 g by mouth daily. Patient taking differently: Take 17 g by mouth daily as needed for moderate constipation. 07/31/21   Veryl Speak, MD  promethazine (PHENERGAN) 12.5 MG tablet Take 1 tablet (12.5 mg total) by mouth every 4 (four) hours as needed for nausea or vomiting. 07/30/22   Joaquim Nam, PA-C  triamterene-hydrochlorothiazide (MAXZIDE-25) 37.5-25 MG tablet Take 1 tablet by mouth every morning. 07/31/21   Veryl Speak, MD     Family History  Problem Relation Age of Onset   Lupus Son    Diabetes Father    Stroke Sister     Social History   Socioeconomic History   Marital status: Married    Spouse name: Not on file   Number of children: Not on file   Years of  education: Not on file   Highest education level: Not on file  Occupational History   Not on file  Tobacco Use   Smoking status: Never   Smokeless tobacco: Never  Substance and Sexual Activity   Alcohol use: No   Drug use: No   Sexual activity: Not on file  Other Topics Concern   Not on file  Social History Narrative   Not on file   Social Determinants of Health   Financial Resource Strain: Not on file  Food Insecurity: Not on file  Transportation Needs: Not on file  Physical Activity: Not on file  Stress: Not on file  Social Connections: Not on file     Vital Signs: There were no vitals taken for this visit.  No physical examination was performed in lieu of virtual telephone clinic visit.  Imaging: MR Pelvis 07/20/22   [place] 07/30/22  Right pre embo   Left pre embo   Post embo  Labs:  CBC: Recent Labs    07/30/22 1033  WBC 6.4  HGB 12.5  HCT 40.3  PLT 247    COAGS: Recent Labs    07/30/22 1033  INR 1.0    BMP: Recent Labs    07/30/22 1033  NA 139  K 3.8  CL 106  CO2 26  GLUCOSE 87  BUN 15  CALCIUM 9.2  CREATININE 0.95  GFRNONAA >60    LIVER FUNCTION TESTS: No results for input(s): "BILITOT", "AST", "ALT", "ALKPHOS", "PROT", "ALBUMIN" in the last 8760 hours.  Assessment and Plan: 46 year old female with symptomatic (menorrhagia, bulk symptoms) leiomyomatous uterus status post uterine artery embolization on 07/30/22.  The procedure went as planned without complication and she was discharged home several hours after the procedure.  She is recovering very well.    Follow up in 3 months.  Electronically Signed: Suzette Battiest 08/15/2022, 10:16 AM   I spent a total of 25 Minutes in virtual telephone clinical consultation, greater than 50% of which was counseling/coordinating care for symptomatic uterine fibroids.

## 2022-09-11 ENCOUNTER — Encounter (HOSPITAL_BASED_OUTPATIENT_CLINIC_OR_DEPARTMENT_OTHER): Payer: Self-pay | Admitting: Emergency Medicine

## 2022-09-11 ENCOUNTER — Other Ambulatory Visit: Payer: Self-pay

## 2022-09-11 ENCOUNTER — Emergency Department (HOSPITAL_BASED_OUTPATIENT_CLINIC_OR_DEPARTMENT_OTHER)
Admission: EM | Admit: 2022-09-11 | Discharge: 2022-09-11 | Discharge status: Against Medical Advice | Payer: Self-pay | Attending: Emergency Medicine | Admitting: Emergency Medicine

## 2022-09-11 DIAGNOSIS — Z20822 Contact with and (suspected) exposure to covid-19: Secondary | ICD-10-CM | POA: Insufficient documentation

## 2022-09-11 DIAGNOSIS — Z5321 Procedure and treatment not carried out due to patient leaving prior to being seen by health care provider: Secondary | ICD-10-CM | POA: Insufficient documentation

## 2022-09-11 DIAGNOSIS — J101 Influenza due to other identified influenza virus with other respiratory manifestations: Secondary | ICD-10-CM | POA: Diagnosis not present

## 2022-09-11 LAB — RESP PANEL BY RT-PCR (RSV, FLU A&B, COVID)  RVPGX2
Influenza A by PCR: POSITIVE — AB
Influenza B by PCR: NEGATIVE
Resp Syncytial Virus by PCR: NEGATIVE
SARS Coronavirus 2 by RT PCR: NEGATIVE

## 2022-09-11 MED ORDER — ONDANSETRON HCL 4 MG PO TABS: 4.0000 mg | ORAL_TABLET | Freq: Four times a day (QID) | ORAL | 0 refills | Status: DC

## 2022-09-11 MED ORDER — ONDANSETRON 4 MG PO TBDP
4.0000 mg | ORAL_TABLET | Freq: Once | ORAL | Status: AC
  Administered 2022-09-11: 4 mg via ORAL
  Filled 2022-09-11: qty 1

## 2022-09-11 MED ORDER — ALUM & MAG HYDROXIDE-SIMETH 200-200-20 MG/5ML PO SUSP
30.0000 mL | Freq: Once | ORAL | Status: AC
  Administered 2022-09-11: 30 mL via ORAL
  Filled 2022-09-11: qty 30

## 2022-09-11 MED ORDER — IBUPROFEN 400 MG PO TABS
600.0000 mg | ORAL_TABLET | Freq: Once | ORAL | Status: AC
  Administered 2022-09-11: 600 mg via ORAL
  Filled 2022-09-11: qty 1

## 2022-09-11 MED ORDER — ACETAMINOPHEN 500 MG PO TABS
ORAL_TABLET | ORAL | Status: AC
  Filled 2022-09-11: qty 1

## 2022-09-11 MED ORDER — IBUPROFEN 600 MG PO TABS: 600.0000 mg | ORAL_TABLET | Freq: Four times a day (QID) | ORAL | 0 refills | Status: AC | PRN

## 2022-09-11 MED ORDER — ACETAMINOPHEN 500 MG PO TABS
1000.0000 mg | ORAL_TABLET | Freq: Once | ORAL | Status: AC
  Administered 2022-09-11: 1000 mg via ORAL
  Filled 2022-09-11: qty 2

## 2022-09-11 NOTE — ED Triage Notes (Signed)
Fevers subjective started Tuesday, yesterday vomiting and body aches, is here with grandson with similar symptoms.

## 2022-09-11 NOTE — ED Notes (Signed)
One tylenol tablet dropped onto the floor. Another '500mg'$  tablet removed from pyxis.

## 2022-09-11 NOTE — ED Provider Notes (Signed)
[place] EMERGENCY DEPT Provider Note   CSN: [id number] Arrival date & time: 09/11/22  5852     History {Add pertinent medical, surgical, social history, OB history to HPI:1} Chief Complaint  Patient presents with   Emesis    Diane Ramsey is a 47 y.o. female.  Diane Ramsey is a 47 y.o. female   The history is provided by the patient.  Emesis      Home Medications Prior to Admission medications   Medication Sig Start Date End Date Taking? Authorizing Provider  Ascorbic Acid (VITAMIN C PO) Take 1 tablet by mouth every other day. Alternate days with multivitamin    [provider]  cyclobenzaprine (FLEXERIL) 10 MG tablet Take 10 mg by mouth daily as needed for muscle spasms. 07/17/22   [provider]  ibuprofen (ADVIL,MOTRIN) 200 MG tablet Take 800 mg by mouth every 6 (six) hours as needed for mild pain. For pain    [provider]  Multiple Vitamin (MULTIVITAMIN WITH MINERALS) TABS tablet Take 1 tablet by mouth every other day. Alternate days with vitamin c    [provider]  oxyCODONE-acetaminophen (PERCOCET) 5-325 MG tablet Take 1 tablet by mouth every 4 (four) hours as needed for severe pain (For breakthrough pain). 07/30/22   Candiss Norse A, PA-C  polyethylene glycol powder (GLYCOLAX/MIRALAX) 17 GM/SCOOP powder Take 17 g by mouth daily. Patient taking differently: Take 17 g by mouth daily as needed for moderate constipation. 07/31/21   Veryl Speak, MD  promethazine (PHENERGAN) 12.5 MG tablet Take 1 tablet (12.5 mg total) by mouth every 4 (four) hours as needed for nausea or vomiting. 07/30/22   Joaquim Nam, PA-C  triamterene-hydrochlorothiazide (MAXZIDE-25) 37.5-25 MG tablet Take 1 tablet by mouth every morning. 07/31/21   Veryl Speak, MD      Allergies    Patient has no known allergies.    Review of Systems   Review of Systems  Gastrointestinal:  Positive for vomiting.    Physical Exam Updated  Vital Signs BP (!) 162/104 (BP Location: Right Wrist)   Pulse (!) 114   Temp (!) 100.5 F (38.1 C)   Resp (!) 22   SpO2 95%  Physical Exam  ED Results / Procedures / Treatments   Labs (all labs ordered are listed, but only abnormal results are displayed) Labs Reviewed  RESP PANEL BY RT-PCR (RSV, FLU A&B, COVID)  RVPGX2 - Abnormal; Notable for the following components:      Result Value   Influenza A by PCR POSITIVE (*)    All other components within normal limits    EKG None  Radiology No results found.  Procedures Procedures  {Document cardiac monitor, telemetry assessment procedure when appropriate:1}  Medications Ordered in ED Medications - No data to display  ED Course/ Medical Decision Making/ A&P                           Medical Decision Making Risk OTC drugs. Prescription drug management.   ***  {Document critical care time when appropriate:1} {Document review of labs and clinical decision tools ie heart score, Chads2Vasc2 etc:1}  {Document your independent review of radiology images, and any outside records:1} {Document your discussion with family members, caretakers, and with consultants:1} {Document social determinants of health affecting pt's care:1} {Document your decision making why or why not admission, treatments were needed:1} Final Clinical Impression(s) / ED Diagnoses Final diagnoses:  None    Rx /  DC Orders ED Discharge Orders     None       

## 2022-11-05 ENCOUNTER — Other Ambulatory Visit: Payer: Self-pay | Admitting: Interventional Radiology

## 2022-11-05 DIAGNOSIS — D250 Submucous leiomyoma of uterus: Secondary | ICD-10-CM

## 2023-01-28 ENCOUNTER — Encounter (HOSPITAL_BASED_OUTPATIENT_CLINIC_OR_DEPARTMENT_OTHER): Payer: Self-pay

## 2023-01-28 ENCOUNTER — Other Ambulatory Visit: Payer: Self-pay

## 2023-01-28 ENCOUNTER — Emergency Department (HOSPITAL_BASED_OUTPATIENT_CLINIC_OR_DEPARTMENT_OTHER)
Admission: EM | Admit: 2023-01-28 | Discharge: 2023-01-28 | Disposition: A | Discharge status: Home / Self Care | Payer: Self-pay | Attending: Emergency Medicine | Admitting: Emergency Medicine

## 2023-01-28 DIAGNOSIS — Z20822 Contact with and (suspected) exposure to covid-19: Secondary | ICD-10-CM | POA: Insufficient documentation

## 2023-01-28 DIAGNOSIS — J020 Streptococcal pharyngitis: Secondary | ICD-10-CM | POA: Insufficient documentation

## 2023-01-28 LAB — RESP PANEL BY RT-PCR (RSV, FLU A&B, COVID)  RVPGX2
Influenza A by PCR: NEGATIVE
Influenza B by PCR: NEGATIVE
Resp Syncytial Virus by PCR: NEGATIVE
SARS Coronavirus 2 by RT PCR: NEGATIVE

## 2023-01-28 LAB — GROUP A STREP BY PCR: Group A Strep by PCR: DETECTED — AB

## 2023-01-28 MED ORDER — DEXAMETHASONE SODIUM PHOSPHATE 10 MG/ML IJ SOLN
10.0000 mg | Freq: Once | INTRAMUSCULAR | Status: AC
  Administered 2023-01-28: 10 mg via INTRAMUSCULAR
  Filled 2023-01-28: qty 1

## 2023-01-28 MED ORDER — LIDOCAINE VISCOUS HCL 2 % MT SOLN
15.0000 mL | Freq: Once | OROMUCOSAL | Status: AC
  Administered 2023-01-28: 15 mL via OROMUCOSAL
  Filled 2023-01-28: qty 15

## 2023-01-28 MED ORDER — AMOXICILLIN-POT CLAVULANATE 500-125 MG PO TABS: 1.0000 | ORAL_TABLET | Freq: Three times a day (TID) | ORAL | 0 refills | Status: AC

## 2023-01-28 MED ORDER — AMOXICILLIN-POT CLAVULANATE 875-125 MG PO TABS
1.0000 | ORAL_TABLET | Freq: Once | ORAL | Status: AC
  Administered 2023-01-28: 1 via ORAL
  Filled 2023-01-28: qty 1

## 2023-01-28 MED ORDER — ONDANSETRON 4 MG PO TBDP
4.0000 mg | ORAL_TABLET | Freq: Once | ORAL | Status: AC
  Administered 2023-01-28: 4 mg via ORAL
  Filled 2023-01-28: qty 1

## 2023-01-28 MED ORDER — ONDANSETRON 4 MG PO TBDP: 4.0000 mg | ORAL_TABLET | Freq: Three times a day (TID) | ORAL | 0 refills | Status: AC | PRN

## 2023-01-28 NOTE — Discharge Instructions (Signed)
You were seen for strep throat in the emergency department.   At home, please take the antibiotics are prescribed you.    Check your MyChart online for the results of any tests that had not resulted by the time you left the emergency department.   Follow-up with your primary doctor in 2-3 days regarding your visit.    Return immediately to the emergency department if you experience any of the following: Difficulty breathing, voice changes, or any other concerning symptoms.    Thank you for visiting our Emergency Department. It was a pleasure taking care of you today.

## 2023-01-28 NOTE — ED Provider Notes (Signed)
[place] EMERGENCY DEPARTMENT AT [facility] Provider Note   CSN: [id number] Arrival date & time: 01/28/23  4098     History  Chief Complaint  Patient presents with   URI   Sore Throat         Diane Ramsey is a 48 y.o. female.  47 year old female previously healthy presents the emergency department with sore throat.  Over the past 4 to 5 days to start developing a sore throat.  Was initially worse on the left side and now is bilateral.  Says that it hurts to swallow.  No voice changes or difficulty breathing.  Has had a fever of 101 F.  Has been taking Tylenol cold and flu and ibuprofen.  Took these this morning just prior to arrival.  Also has had some nausea with 2 episodes of nonbloody nonbilious emesis.  Decided to come to the emergency department because the pain was worsening.  Says that she had a mild cough that just started today that is nonproductive.       Home Medications Prior to Admission medications   Medication Sig Start Date End Date Taking? Authorizing Provider  amoxicillin-clavulanate (AUGMENTIN) 500-125 MG tablet Take 1 tablet by mouth every 8 (eight) hours for 10 days. 01/28/23 02/07/23 Yes Rondel Baton, MD  ondansetron (ZOFRAN-ODT) 4 MG disintegrating tablet Take 1 tablet (4 mg total) by mouth every 8 (eight) hours as needed for nausea or vomiting. 01/28/23  Yes Rondel Baton, MD  Ascorbic Acid (VITAMIN C PO) Take 1 tablet by mouth every other day. Alternate days with multivitamin    [provider]  cyclobenzaprine (FLEXERIL) 10 MG tablet Take 10 mg by mouth daily as needed for muscle spasms. 07/17/22   [provider]  ibuprofen (ADVIL) 600 MG tablet Take 1 tablet (600 mg total) by mouth every 6 (six) hours as needed. 09/11/22   Dartha Lodge, PA-C  Multiple Vitamin (MULTIVITAMIN WITH MINERALS) TABS tablet Take 1 tablet by mouth every other day. Alternate days with vitamin c    [provider]   oxyCODONE-acetaminophen (PERCOCET) 5-325 MG tablet Take 1 tablet by mouth every 4 (four) hours as needed for severe pain (For breakthrough pain). 07/30/22   Lynnette Caffey A, PA-C  polyethylene glycol powder (GLYCOLAX/MIRALAX) 17 GM/SCOOP powder Take 17 g by mouth daily. Patient taking differently: Take 17 g by mouth daily as needed for moderate constipation. 07/31/21   Geoffery Lyons, MD  promethazine (PHENERGAN) 12.5 MG tablet Take 1 tablet (12.5 mg total) by mouth every 4 (four) hours as needed for nausea or vomiting. 07/30/22   Villa Herb, PA-C  triamterene-hydrochlorothiazide (MAXZIDE-25) 37.5-25 MG tablet Take 1 tablet by mouth every morning. 07/31/21   Geoffery Lyons, MD      Allergies    Patient has no known allergies.    Review of Systems   Review of Systems  Physical Exam Updated Vital Signs BP (!) 148/86   Pulse 92   Temp 98.6 F (37 C)   Resp 16   SpO2 98%  Physical Exam Vitals and nursing note reviewed.  Constitutional:      General: She is not in acute distress.    Appearance: She is well-developed.     Comments: Speaking in full sentences.  No voice changes noted.  Tolerating her secretions  HENT:     Head: Normocephalic and atraumatic.     Right Ear: External ear normal.     Left Ear: External ear normal.  Nose: Nose normal.     Mouth/Throat:     Mouth: Mucous membranes are moist.     Pharynx: Oropharyngeal exudate and posterior oropharyngeal erythema present.  Eyes:     Extraocular Movements: Extraocular movements intact.     Conjunctiva/sclera: Conjunctivae normal.     Pupils: Pupils are equal, round, and reactive to light.  Cardiovascular:     Rate and Rhythm: Normal rate and regular rhythm.     Heart sounds: No murmur heard. Pulmonary:     Effort: Pulmonary effort is normal. No respiratory distress.     Breath sounds: Normal breath sounds.  Musculoskeletal:     Cervical back: Normal range of motion and neck supple.  Skin:     General: Skin is warm and dry.  Neurological:     Mental Status: She is alert and oriented to person, place, and time. Mental status is at baseline.  Psychiatric:        Mood and Affect: Mood normal.     ED Results / Procedures / Treatments   Labs (all labs ordered are listed, but only abnormal results are displayed) Labs Reviewed  GROUP A STREP BY PCR - Abnormal; Notable for the following components:      Result Value   Group A Strep by PCR DETECTED (*)    All other components within normal limits  RESP PANEL BY RT-PCR (RSV, FLU A&B, COVID)  RVPGX2    EKG None  Radiology No results found.  Procedures Procedures    Medications Ordered in ED Medications  ondansetron (ZOFRAN-ODT) disintegrating tablet 4 mg (has no administration in time range)  lidocaine (XYLOCAINE) 2 % viscous mouth solution 15 mL (has no administration in time range)  dexamethasone (DECADRON) injection 10 mg (has no administration in time range)  amoxicillin-clavulanate (AUGMENTIN) 875-125 MG per tablet 1 tablet (has no administration in time range)    ED Course/ Medical Decision Making/ A&P                             Medical Decision Making Risk Prescription drug management.   Diane Ramsey is a 47 y.o. female previously healthy presents emergency department sore throat and fever  Initial Ddx:  Strep throat, peritonsillar abscess, RPA, viral pharyngitis, pneumonia   MDM:  Feel the patient likely has a pharyngitis based on their symptoms.  Centor score is 2 and will send off a strep test at this time.  No signs of peritonsillar abscess or RPA at this time.  They are overall well-appearing so do not feel that chest x-ray is warranted.   Plan:  COVID/flu Strep test  ED Summary/Re-evaluation:  Patient was found to be strep positive.  Was given a prescription for amoxicillin to take at home.  Have her follow-up with her primary doctor in 2 to 3 days.  This patient presents to the ED for concern  of complaints listed in HPI, this involves an extensive number of treatment options, and is a complaint that carries with it a high risk of complications and morbidity. Disposition including potential need for admission considered.   Dispo: DC Home. Return precautions discussed including, but not limited to, those listed in the AVS. Allowed pt time to ask questions which were answered fully prior to dc.        Final Clinical Impression(s) / ED Diagnoses Final diagnoses:  Strep throat    Rx / DC Orders ED Discharge Orders  Ordered    ondansetron (ZOFRAN-ODT) 4 MG disintegrating tablet  Every 8 hours PRN        01/28/23 1031    amoxicillin-clavulanate (AUGMENTIN) 500-125 MG tablet  Every 8 hours        01/28/23 1052              Rondel Baton, MD 01/28/23 1056

## 2023-01-28 NOTE — ED Triage Notes (Signed)
Pt c/o fever x "past few days," fever at home,  associated URI symptoms. Endorses vomiting x2 days. States she was using OTC medications for symptoms, but "wants to know what's going on."   Tylenol cold/flu PTA Also endorses sore throat

## 2024-08-23 ENCOUNTER — Other Ambulatory Visit: Payer: Self-pay

## 2024-08-23 ENCOUNTER — Emergency Department (HOSPITAL_COMMUNITY)
Admission: EM | Admit: 2024-08-23 | Discharge: 2024-08-23 | Disposition: A | Discharge status: Home / Self Care | Payer: Self-pay | Source: Home / Self Care | Attending: Emergency Medicine | Admitting: Emergency Medicine

## 2024-08-23 DIAGNOSIS — U071 COVID-19: Secondary | ICD-10-CM

## 2024-08-23 LAB — RESP PANEL BY RT-PCR (RSV, FLU A&B, COVID)  RVPGX2
Influenza A by PCR: NEGATIVE
Influenza B by PCR: NEGATIVE
Resp Syncytial Virus by PCR: NEGATIVE
SARS Coronavirus 2 by RT PCR: POSITIVE — AB

## 2024-08-23 NOTE — Discharge Instructions (Signed)
 Tylenol  every 4 hours for fever or body aches.  Return if any problems.

## 2024-08-23 NOTE — ED Triage Notes (Signed)
 PT ambulatory to triage with complaints of cough, shortness of breath, clear-yellow mucus, runny nose, and general malaise. Pt reports a 2 brief episodes of pain shooting up LEFT arm. 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

## 2024-08-23 NOTE — ED Provider Triage Note (Signed)
 Emergency Medicine Provider Triage Evaluation Note  Diane Ramsey , a 48 y.o. female  was evaluated in triage.  Pt complains of a cough and congestion.  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  Patient's blood pressure is high.  Patient did not take her blood pressure medicine this morning  Review of Systems  Positive: Cough and congestion Negative:   Physical Exam  BP (!) 166/107 (BP Location: Left Arm)   Pulse 92   Temp 98.7 F (37.1 C) (Oral)   Resp 20   SpO2 100%  Gen:   Awake, no distress   Resp:  Normal effort  MSK:   Moves extremities without difficulty  Other:    Medical Decision Making  Medically screening exam initiated at 2:50 PM.  Appropriate orders placed.  Diane Ramsey was informed that the remainder of the evaluation will be completed by another provider, this initial triage assessment does not replace that evaluation, and the importance of remaining in the ED until their evaluation is complete.     Flint Sonny POUR, PA-C 08/23/24 1451

## 2024-08-23 NOTE — ED Provider Notes (Signed)
 [place] EMERGENCY DEPARTMENT AT [facility] Provider Note   CSN: [id number] Arrival date & time: 08/23/24  1412     Patient presents with: URI, Generalized Body Aches, and Shortness of Breath   Diane Ramsey is a 48 y.o. female.   Patient complains of a cough and congestion.  Patient reports that she has been coughing frequently.  Patient is requesting testing for flu and COVID.  She is here with her son who has just had surgery and she is concerned that she could have an infection.  Patient denies any nausea or vomiting she has not had any abdominal pain.  She denies any current shortness of breath she has not had a fever.  Patient does complain of some bodyaches  The history is provided by the patient. No language interpreter was used.  URI Shortness of Breath      Prior to Admission medications  Medication Sig Start Date End Date Taking? Authorizing Provider  Ascorbic Acid (VITAMIN C PO) Take 1 tablet by mouth every other day. Alternate days with multivitamin    [provider]  cyclobenzaprine (FLEXERIL) 10 MG tablet Take 10 mg by mouth daily as needed for muscle spasms. 07/17/22   [provider]  ibuprofen  (ADVIL ) 600 MG tablet Take 1 tablet (600 mg total) by mouth every 6 (six) hours as needed. 09/11/22   Kehrli, Kelsey F, PA-C  Multiple Vitamin (MULTIVITAMIN WITH MINERALS) TABS tablet Take 1 tablet by mouth every other day. Alternate days with vitamin c    [provider]  ondansetron  (ZOFRAN -ODT) 4 MG disintegrating tablet Take 1 tablet (4 mg total) by mouth every 8 (eight) hours as needed for nausea or vomiting. 01/28/23   Yolande Lamar BROCKS, MD  oxyCODONE -acetaminophen  (PERCOCET) 5-325 MG tablet Take 1 tablet by mouth every 4 (four) hours as needed for severe pain (For breakthrough pain). 07/30/22   Neita Kirsch A, PA-C  polyethylene glycol powder (GLYCOLAX /MIRALAX ) 17 GM/SCOOP powder Take 17 g by mouth daily. Patient taking  differently: Take 17 g by mouth daily as needed for moderate constipation. 07/31/21   Geroldine Berg, MD  promethazine  (PHENERGAN ) 12.5 MG tablet Take 1 tablet (12.5 mg total) by mouth every 4 (four) hours as needed for nausea or vomiting. 07/30/22   Neita Kirsch A, PA-C  triamterene -hydrochlorothiazide  (MAXZIDE -25) 37.5-25 MG tablet Take 1 tablet by mouth every morning. 07/31/21   Geroldine Berg, MD    Allergies: Patient has no known allergies.    Review of Systems  Respiratory:  Positive for shortness of breath.   All other systems reviewed and are negative.   Updated Vital Signs BP (!) 166/107 (BP Location: Left Arm)   Pulse 92   Temp 98.7 F (37.1 C) (Oral)   Resp 20   SpO2 100%   Physical Exam Vitals and nursing note reviewed.  Constitutional:      Appearance: She is well-developed.  HENT:     Head: Normocephalic.  Cardiovascular:     Rate and Rhythm: Normal rate and regular rhythm.  Pulmonary:     Effort: Pulmonary effort is normal.     Breath sounds: Normal breath sounds. No wheezing, rhonchi or rales.  Abdominal:     General: There is no distension.  Musculoskeletal:        General: Normal range of motion.     Cervical back: Normal range of motion.  Skin:    General: Skin is warm.  Neurological:     General: No focal deficit present.  Mental Status: She is alert and oriented to person, place, and time.     (all labs ordered are listed, but only abnormal results are displayed) Labs Reviewed  RESP PANEL BY RT-PCR (RSV, FLU A&B, COVID)  RVPGX2 - Abnormal; Notable for the following components:      Result Value   SARS Coronavirus 2 by RT PCR POSITIVE (*)    All other components within normal limits    EKG: None  Radiology: No results found.   Procedures   Medications Ordered in the ED - No data to display                                  Medical Decision Making Patient complains of a cough and congestion.  Patient is concerned that she  could have an infection she is requesting flu and COVID testing.  Amount and/or Complexity of Data Reviewed Labs: ordered. Decision-making details documented in ED Course.    Details: Labs ordered reviewed and interpreted patient's COVID is positive.  Risk Risk Details: Patient left the department to go be with her son who has surgery.  I contacted her by phone.  I discussed the diagnosis of COVID.  Patient is advised to wear a mask.  She is advised take Tylenol  for fever and bodyaches.  A note is printed putting her out of work for the next 4 days.        Final diagnoses:  COVID    ED Discharge Orders     None      An After Visit Summary was printed and given to the patient.     Flint Sonny POUR, PA-C 08/23/24 1857    Francesca Elsie CROME, MD 08/23/24 [phone]

## 2024-08-24 NOTE — ED Notes (Signed)
 Opened chart to modify note for employer.
